# Patient Record
Sex: Female | Born: 1980 | Race: White | Hispanic: No | Marital: Single | Smoking: Never smoker
Health system: Southern US, Community
[De-identification: ages and names within clinical notes are randomized; demographics above are authoritative.]

## PROBLEM LIST (undated history)

## (undated) DIAGNOSIS — M5432 Sciatica, left side: Principal | ICD-10-CM

## (undated) DIAGNOSIS — E559 Vitamin D deficiency, unspecified: Secondary | ICD-10-CM

## (undated) DIAGNOSIS — F39 Unspecified mood [affective] disorder: Secondary | ICD-10-CM

## (undated) DIAGNOSIS — K219 Gastro-esophageal reflux disease without esophagitis: Secondary | ICD-10-CM

## (undated) DIAGNOSIS — Z87442 Personal history of urinary calculi: Secondary | ICD-10-CM

## (undated) DIAGNOSIS — R5383 Other fatigue: Secondary | ICD-10-CM

## (undated) DIAGNOSIS — I10 Essential (primary) hypertension: Secondary | ICD-10-CM

## (undated) DIAGNOSIS — G43909 Migraine, unspecified, not intractable, without status migrainosus: Secondary | ICD-10-CM

## (undated) DIAGNOSIS — G4730 Sleep apnea, unspecified: Secondary | ICD-10-CM

## (undated) DIAGNOSIS — F32A Depression, unspecified: Secondary | ICD-10-CM

## (undated) DIAGNOSIS — F419 Anxiety disorder, unspecified: Secondary | ICD-10-CM

## (undated) DIAGNOSIS — E039 Hypothyroidism, unspecified: Secondary | ICD-10-CM

## (undated) HISTORY — DX: Hypothyroidism, unspecified: E03.9

## (undated) HISTORY — DX: Other fatigue: R53.83

## (undated) HISTORY — DX: Morbid (severe) obesity due to excess calories: E66.01

## (undated) HISTORY — DX: Vitamin D deficiency, unspecified: E55.9

## (undated) HISTORY — DX: Migraine, unspecified, not intractable, without status migrainosus: G43.909

## (undated) HISTORY — DX: Gastro-esophageal reflux disease without esophagitis: K21.9

## (undated) HISTORY — DX: Essential (primary) hypertension: I10

## (undated) HISTORY — DX: Unspecified mood (affective) disorder: F39

## (undated) HISTORY — DX: Sciatica, left side: M54.32

---
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
## 2006-11-01 ENCOUNTER — Emergency Department (HOSPITAL_COMMUNITY): Admission: EM | Admit: 2006-11-01 | Discharge: 2006-11-01 | Payer: Self-pay | Admitting: Emergency Medicine

## 2015-06-04 ENCOUNTER — Other Ambulatory Visit: Payer: Self-pay | Admitting: Family Medicine

## 2015-06-04 DIAGNOSIS — M545 Low back pain: Secondary | ICD-10-CM

## 2015-06-12 ENCOUNTER — Ambulatory Visit
Admission: RE | Admit: 2015-06-12 | Discharge: 2015-06-12 | Disposition: A | Discharge status: Home / Self Care | Payer: 59 | Source: Ambulatory Visit | Attending: Family Medicine | Admitting: Family Medicine

## 2015-06-12 DIAGNOSIS — M545 Low back pain: Secondary | ICD-10-CM

## 2015-10-27 ENCOUNTER — Encounter: Payer: Self-pay | Admitting: Neurology

## 2015-10-27 ENCOUNTER — Ambulatory Visit (INDEPENDENT_AMBULATORY_CARE_PROVIDER_SITE_OTHER): Payer: Self-pay | Admitting: Neurology

## 2015-10-27 ENCOUNTER — Ambulatory Visit (INDEPENDENT_AMBULATORY_CARE_PROVIDER_SITE_OTHER): Payer: 59 | Admitting: Neurology

## 2015-10-27 DIAGNOSIS — M5432 Sciatica, left side: Secondary | ICD-10-CM | POA: Diagnosis not present

## 2015-10-27 HISTORY — DX: Sciatica, left side: M54.32

## 2015-10-27 NOTE — Progress Notes (Signed)
Please refer to EMG and nerve conduction study procedure note. 

## 2015-10-27 NOTE — Procedures (Signed)
     HISTORY:  Cynthia Green is a 35 year old patient with morbid obesity with onset of left buttocks pain with pain down the left leg to the ankle, some paresthesias involving the big toe and the second toe. The patient has had symptoms for about one year. She is being evaluated for a possible lumbar radiculopathy.  NERVE CONDUCTION STUDIES:  Nerve conduction studies were performed on both lower extremities. The distal motor latencies and motor amplitudes for the peroneal and posterior tibial nerves were within normal limits. The nerve conduction velocities for these nerves were also normal. The H reflex latencies were normal. The sensory latencies for the peroneal nerves were within normal limits.   EMG STUDIES:  EMG study was performed on the left lower extremity:  The tibialis anterior muscle reveals 2 to 4K motor units with full recruitment. No fibrillations or positive waves were seen. The peroneus tertius muscle reveals 2 to 4K motor units with full recruitment. No fibrillations or positive waves were seen. The medial gastrocnemius muscle reveals 1 to 3K motor units with full recruitment. No fibrillations or positive waves were seen. The vastus lateralis muscle reveals 2 to 4K motor units with full recruitment. No fibrillations or positive waves were seen. The iliopsoas muscle reveals 2 to 4K motor units with full recruitment. No fibrillations or positive waves were seen. The biceps femoris muscle (long head) reveals 2 to 4K motor units with full recruitment. No fibrillations or positive waves were seen. The lumbosacral paraspinal muscles were tested at 3 levels, and revealed no abnormalities of insertional activity at all 3 levels tested. There was good relaxation.   IMPRESSION:  Nerve conduction studies done on both lower extremities were unremarkable, no evidence of a peripheral neuropathy is seen. EMG evaluation of the left lower extremity was unremarkable, without evidence  of an overlying lumbosacral radiculopathy.  Marlan Palau MD 10/27/2015 1:45 PM  [facility] [address] [place], [place] [address]  Phone [phone] Fax [phone]

## 2016-05-10 ENCOUNTER — Telehealth (HOSPITAL_COMMUNITY): Payer: Self-pay

## 2016-05-10 NOTE — Telephone Encounter (Signed)
PT would like an appt w/ Dr. Michae KavaAgarwal. PT is seeing a therapist and also a psych at this time. She would like to change psychiatrists. Gave her fax info to send psych notes and referral from her therapist.

## 2016-09-13 IMAGING — MR MR LUMBAR SPINE W/O CM
4 of 5 series · 26 of 48 positions shown · non-contrast
Comparison: None.

CLINICAL DATA: Low back pain radiating to the left posterior leg

EXAM:
MRI LUMBAR SPINE WITHOUT CONTRAST
TECHNIQUE: Multiplanar, multisequence MR imaging of the lumbar spine was
performed. No intravenous contrast was administered.

[Series 3: T2 · sagittal · 4.0mm · 0.55mm/px · 5 of 12 slices shown (1 of 2)]
[im 1/12]
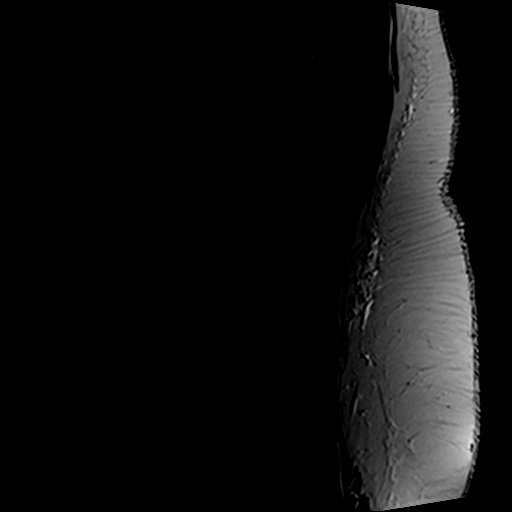
[im 3/12]
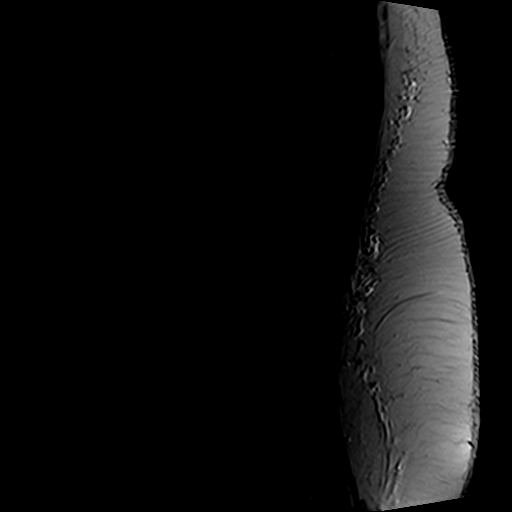
[im 6/12]
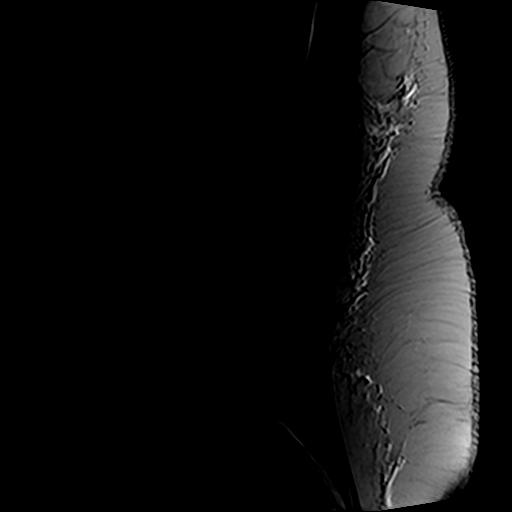
[im 9/12]
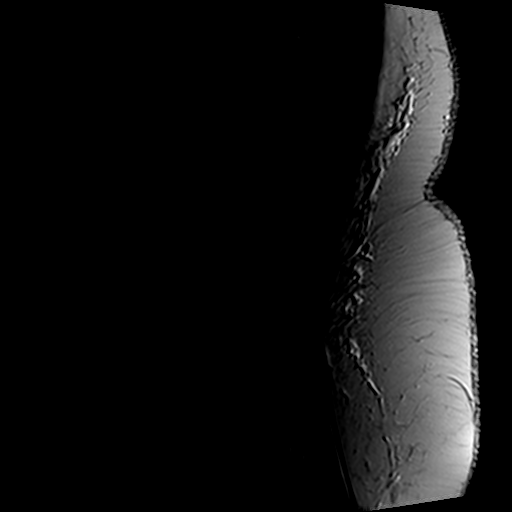
[im 12/12]
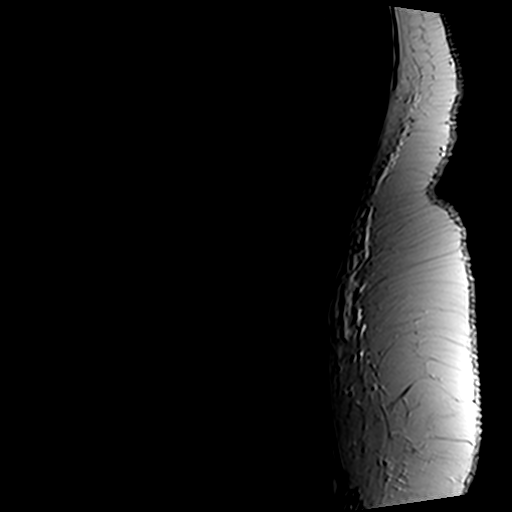

[Series 4: T1 · sagittal · 4.0mm · 0.55mm/px · 5 of 12 slices shown (1 of 2)]
[im 1/12]
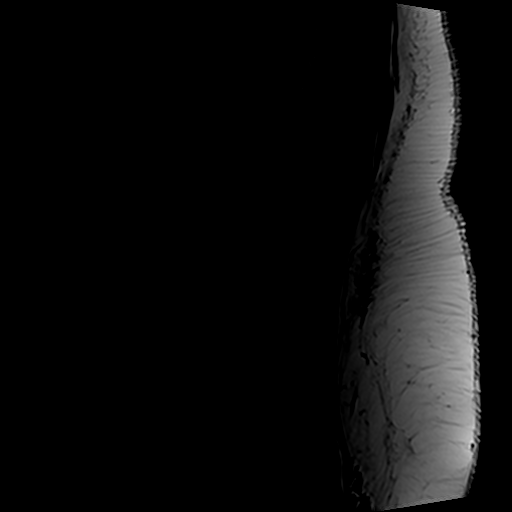
[im 3/12]
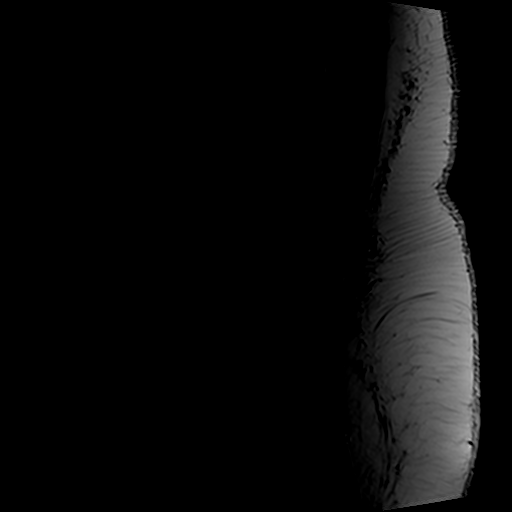
[im 6/12]
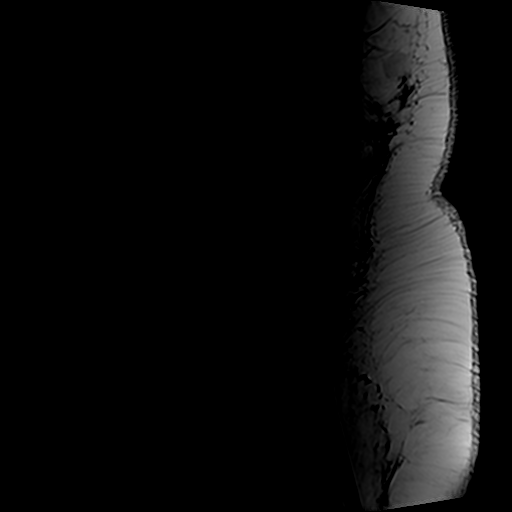
[im 9/12]
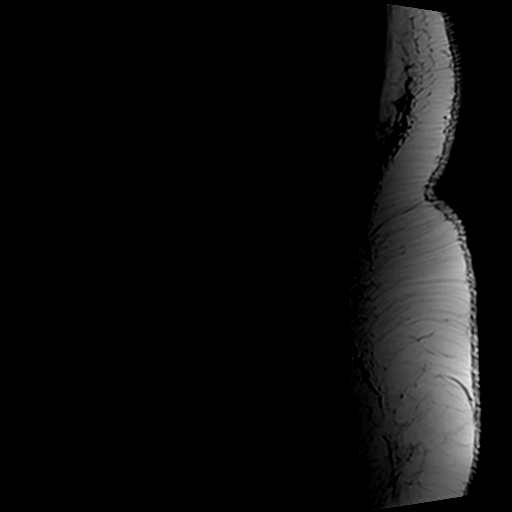
[im 12/12]
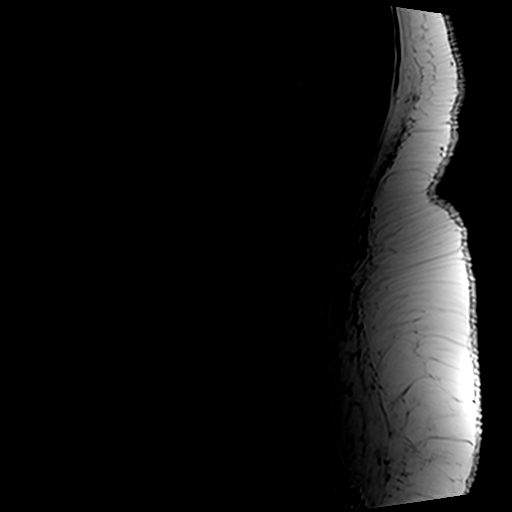

[Series 6: T2 · axial · 4.0mm · 0.70mm/px · z∈[-78,+85]mm · 10 of 34 slices shown (2 of 2)]
[im 3/34]
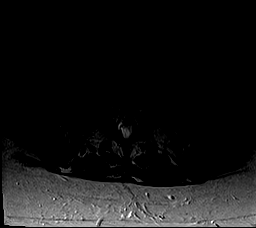
[im 5/34]
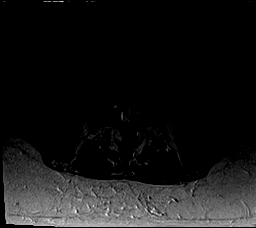
[im 7/34]
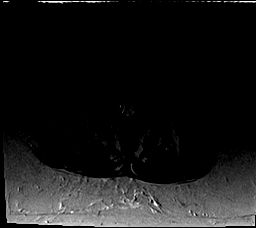
[im 12/34]
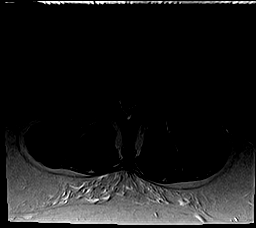
[im 16/34]
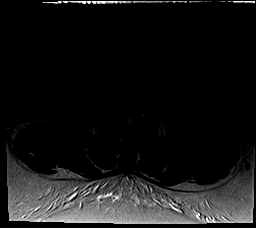
[im 18/34]
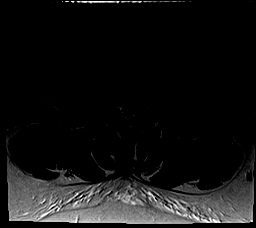
[im 20/34]
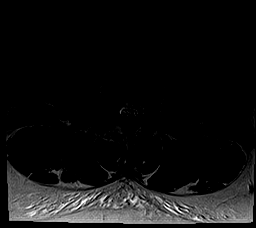
[im 25/34]
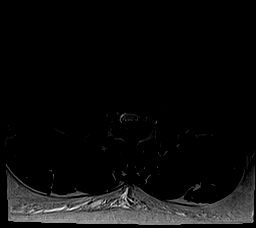
[im 29/34]
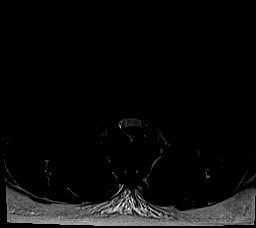
[im 34/34]
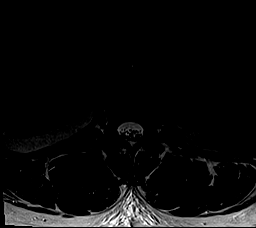

[Series 7: T1 · axial · 4.0mm · 0.35mm/px · z∈[-78,+59]mm · 6 of 34 slices shown (2 of 2)]
[im 3/34]
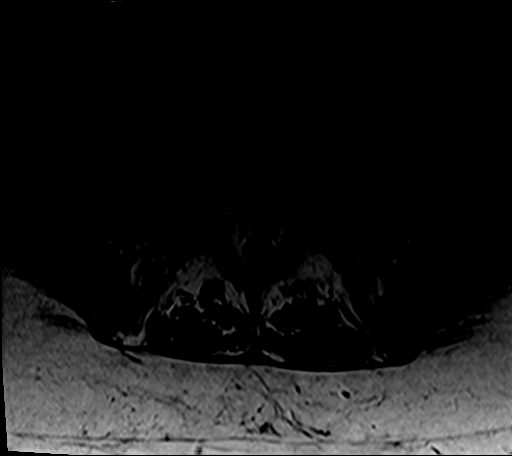
[im 5/34]
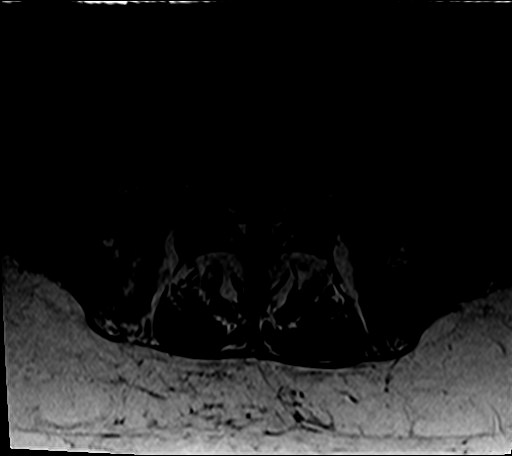
[im 7/34]
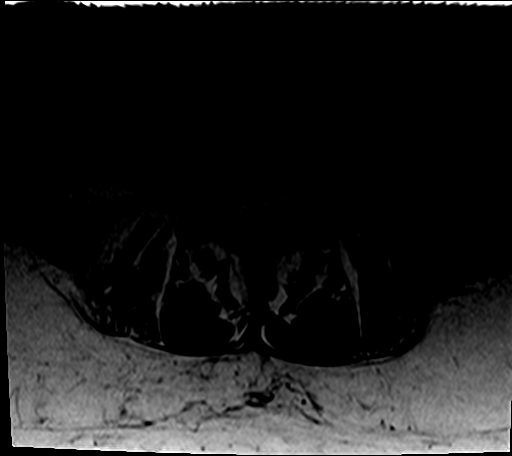
[im 12/34]
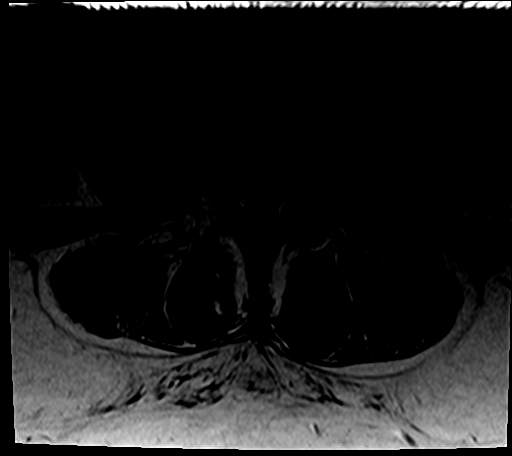
[im 18/34]
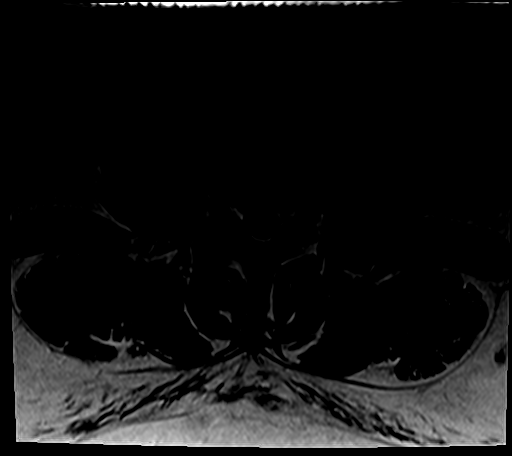
[im 29/34]
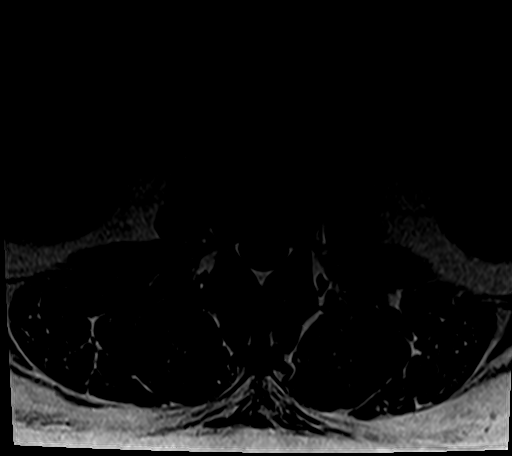

[26 of 48 positions shown; findings below may reference images not displayed]

FINDINGS: The vertebral bodies of the lumbar spine are normal in size. The
vertebral bodies of the lumbar spine are normal in alignment. There
is normal bone marrow signal demonstrated throughout the vertebra.
The intervertebral disc spaces are well-maintained.

The spinal cord is normal in signal and contour. The cord terminates
normally at T12 . The nerve roots of the cauda equina and the filum
terminale are normal. Relative canal narrowing from L3 through S1
likely secondary to short pedicles.

The visualized portions of the SI joints are unremarkable.

The imaged intra-abdominal contents are unremarkable.

T12-L1: No significant disc bulge. No evidence of neural foraminal
stenosis. No central canal stenosis.

L1-L2: No significant disc bulge. No evidence of neural foraminal
stenosis. No central canal stenosis.

L2-L3: No significant disc bulge. No evidence of neural foraminal
stenosis. No central canal stenosis.

L3-L4: Shallow right paracentral disc protrusion. Mild bilateral
facet arthropathy. No evidence of neural foraminal stenosis. No
central canal stenosis.

L4-L5: Mild broad-based disc bulge. Moderate bilateral facet
arthropathy. No evidence of neural foraminal stenosis. No central
canal stenosis.

L5-S1: Eccentric right broad-based disc bulge. Mild bilateral facet
arthropathy. Mild bilateral foraminal narrowing, right greater than
left No central canal stenosis.
IMPRESSION: 1. At L3-4 there is a shallow right paracentral disc protrusion with
mild bilateral facet arthropathy.
2. At L4-5 there is a mild broad-based disc bulge with moderate
bilateral facet arthropathy.
3. At L5-S1 there is eccentric right broad-based disc bulge with
mild bilateral foraminal narrowing, right greater than left.

## 2018-11-21 ENCOUNTER — Ambulatory Visit: Payer: BLUE CROSS/BLUE SHIELD | Admitting: Neurology

## 2018-11-21 ENCOUNTER — Other Ambulatory Visit: Payer: Self-pay

## 2018-11-21 ENCOUNTER — Encounter: Payer: Self-pay | Admitting: Neurology

## 2018-11-21 VITALS — BP 126/92 | HR 103 | Resp 98 | Ht 65.0 in | Wt >= 6400 oz

## 2018-11-21 DIAGNOSIS — Z6841 Body Mass Index (BMI) 40.0 and over, adult: Secondary | ICD-10-CM

## 2018-11-21 DIAGNOSIS — R0683 Snoring: Secondary | ICD-10-CM | POA: Diagnosis not present

## 2018-11-21 DIAGNOSIS — Z9189 Other specified personal risk factors, not elsewhere classified: Secondary | ICD-10-CM

## 2018-11-21 DIAGNOSIS — G4719 Other hypersomnia: Secondary | ICD-10-CM | POA: Diagnosis not present

## 2018-11-21 DIAGNOSIS — R51 Headache: Secondary | ICD-10-CM

## 2018-11-21 DIAGNOSIS — R519 Headache, unspecified: Secondary | ICD-10-CM

## 2018-11-21 NOTE — Patient Instructions (Signed)

## 2018-11-21 NOTE — Progress Notes (Signed)
Subjective:    Patient ID: Cynthia Green is a 38 y.o. female.  HPI     Huston Foley, MD, PhD [facility] [address] [address] [place], [place] [address]  Dear Dr. Earnest Bailey,   I saw your patient, Cynthia Green, upon your kind request in my sleep clinic today for initial consultation of her sleep disorder, in particular, concern for underlying obstructive sleep apnea. The patient is unaccompanied today. As you know, [name] is a 38 year old right-handed woman with an underlying medical history of reflux disease, hypertension, mood disorder, hypothyroidism, vitamin D deficiency, migraine headaches, low back pain, and morbid obesity with a BMI of over 70, who reports snoring and excessive daytime somnolence. I reviewed your office note from 08/14/2018, which you kindly included. Her Epworth sleepiness score is 11 out of 24 today, fatigue score is 54 out of 63. She is single and lives alone, no children. She works for [employer]. She is a nonsmoker and drinks alcohol infrequently, 1 glass every few months on average. He drinks caffeine in the form of reg soda, 20-40 ounces daily. Of note, she is on fluoxetine, as well as potentially sedating medications including lamotrigine 400 mg daily and topiramate 50 mg daily. Of note, she was also started recently on a long-acting carbamazepine. She takes 100 mg at night. She takes the lamotrigine in the morning. Her bedtime is generally between 9 and 10, on weekends she can sleep and an may sleep up to 10 hours or even longer, does not wake up refreshed. She has a history of recurrent headaches, was labeled with cluster headaches in the past as I understand. She has woken up with a headache and also has had nocturnal headaches. She denies a family history of sleep apnea but reports a family history of restless leg syndrome. She has had some infrequent restless legs type symptoms herself. Stretching exercises help  with that. Rise time is variable, generally anywhere between 5 AM and 6:15 AM. Her commute is about 30 minutes. She denies sleepiness at the wheel. She has rarely woken up with a sense of gasping for air. She had recent blood work through your office, she has previously also had her B12 level checked. She had TSH, CBC with differential and CMP checked most recently in your office. She reported that labs were fine. She has 2 cats in the household which need to be fed at 7 AM, on the weekend, she sleeps after feeding her cats.  Her Past Medical History Is Significant For: Past Medical History:  Diagnosis Date  . Chronic sciatica of left side 10/27/2015  . Fatigue   . GERD (gastroesophageal reflux disease)   . Hypertension   . Hypothyroid   . Migraine   . Mood disorder (HCC)   . Morbid obesity (HCC)   . Vitamin D deficiency     Her Past Surgical History Is Significant For: No past surgical history on file.  Her Family History Is Significant For: No family history on file.  Her Social History Is Significant For: Social History   Socioeconomic History  . Marital status: Single    Spouse name: Not on file  . Number of children: Not on file  . Years of education: Not on file  . Highest education level: Not on file  Occupational History  . Not on file  Social Needs  . Financial resource strain: Not on file  . Food insecurity:    Worry: Not on file  Inability: Not on file  . Transportation needs:    Medical: Not on file    Non-medical: Not on file  Tobacco Use  . Smoking status: Never Smoker  . Smokeless tobacco: Never Used  Substance and Sexual Activity  . Alcohol use: Yes  . Drug use: Not on file  . Sexual activity: Not on file  Lifestyle  . Physical activity:    Days per week: Not on file    Minutes per session: Not on file  . Stress: Not on file  Relationships  . Social connections:    Talks on phone: Not on file    Gets together: Not on file    Attends religious  service: Not on file    Active member of club or organization: Not on file    Attends meetings of clubs or organizations: Not on file    Relationship status: Not on file  Other Topics Concern  . Not on file  Social History Narrative  . Not on file    Her Allergies Are:  Allergies  Allergen Reactions  . Morphine And Related Rash  :   Her Current Medications Are:  Outpatient Encounter Medications as of 11/21/2018  Medication Sig  . carbamazepine (EQUETRO) 200 MG CP12 12 hr capsule Take 200 mg by mouth.  Marland Kitchen FLUoxetine (PROZAC) 20 MG capsule Take 20 mg by mouth daily.  . hydrochlorothiazide (HYDRODIURIL) 25 MG tablet Take 25 mg by mouth daily.  Marland Kitchen lamoTRIgine (LAMICTAL) 200 MG tablet Take 200 mg by mouth daily.  Marland Kitchen levothyroxine (SYNTHROID, LEVOTHROID) 88 MCG tablet Take 88 mcg by mouth daily before breakfast.  . topiramate (TOPAMAX) 50 MG tablet Take 50 mg by mouth.  . vitamin B-12 (CYANOCOBALAMIN) 1000 MCG tablet Take 1,000 mcg by mouth daily.  Marland Kitchen VITAMIN D PO Take 2,000 Units by mouth daily.   No facility-administered encounter medications on file as of 11/21/2018.   :  Review of Systems:  Out of a complete 14 point review of systems, all are reviewed and negative with the exception of these symptoms as listed below: Review of Systems  Neurological:       Pt presents today to discuss her sleep. Pt has never had a sleep study but does endorse snoring.  Epworth Sleepiness Scale 0= would never doze 1= slight chance of dozing 2= moderate chance of dozing 3= high chance of dozing  Sitting and reading: 3 Watching TV: 2 Sitting inactive in a public place (ex. Theater or meeting): 1 As a passenger in a car for an hour without a break: 0 Lying down to rest in the afternoon: 3 Sitting and talking to someone: 0 Sitting quietly after lunch (no alcohol): 2 In a car, while stopped in traffic: 0 Total: 11     Objective:  Neurological Exam  Physical Exam Physical Examination:    Vitals:   11/21/18 0856  BP: (!) 126/92  Pulse: (!) 103  Resp: (!) 98   General Examination: The patient is a very pleasant 38 y.o. female in no acute distress. She appears well-developed and well-nourished and well groomed.   HEENT: Normocephalic, atraumatic, pupils are equal, round and reactive to light and accommodation. Extraocular tracking is good without limitation to gaze excursion or nystagmus noted. Normal smooth pursuit is noted. Hearing is grossly intact. Face is symmetric with normal facial animation and normal facial sensation. Speech is clear with no dysarthria noted. There is no hypophonia. There is no lip, neck/head, jaw or voice tremor. Neck is  supple with full range of passive and active motion. There are no carotid bruits on auscultation. Oropharynx exam reveals: mild mouth dryness, adequate dental hygiene and moderate airway crowding, due to smaller airway entry, prominent uvula, tonsils off about 1-2+ bilaterally. Tongue protrudes centrally and palate elevates symmetrically. Mallampati is class II. Neck circumference is 19-1/4 inches. She has a mild/minimal overbite.  Chest: Clear to auscultation without wheezing, rhonchi or crackles noted.  Heart: S1+S2+0, regular and normal without murmurs, rubs or gallops noted.   Abdomen: Soft, non-tender and non-distended with normal bowel sounds appreciated on auscultation.  Extremities: There is trace pitting edema in the distal lower extremities bilaterally.  Skin: Warm and dry without trophic changes noted.  Musculoskeletal: exam reveals no obvious joint deformities, tenderness or joint swelling or erythema.   Neurologically:  Mental status: The patient is awake, alert and oriented in all 4 spheres. Her immediate and remote memory, attention, language skills and fund of knowledge are appropriate. There is no evidence of aphasia, agnosia, apraxia or anomia. Speech is clear with normal prosody and enunciation. Thought process is  linear. Mood is normal and affect is normal.  Cranial nerves II - XII are as described above under HEENT exam. In addition: shoulder shrug is normal with equal shoulder height noted. Motor exam: Normal bulk, strength and tone is noted. There is no drift, tremor or rebound. Romberg is negative. Fine motor skills and coordination: intact with normal finger taps, normal hand movements, normal rapid alternating patting, normal foot taps and normal foot agility.  Cerebellar testing: No dysmetria or intention tremor on finger to nose testing. Heel to shin is unremarkable bilaterally. There is no truncal or gait ataxia.  Sensory exam: intact to light touch in the upper and lower extremities.  Gait, station and balance: She stands easily. No veering to one side is noted. No leaning to one side is noted. Posture is age-appropriate and stance is narrow based. Gait shows normal stride length and normal pace. No problems turning are noted.   Assessment and Plan:  In summary, VERCIE ROADMAN is a very pleasant 38 y.o.-year old female with an underlying medical history of reflux disease, hypertension, mood disorder, hypothyroidism, vitamin D deficiency, migraine headaches, low back pain, and morbid obesity with a BMI of over 70, whose history and physical exam are concerning for obstructive sleep apnea (OSA). I had a long chat with the patient about my findings and the diagnosis of OSA, its prognosis and treatment options. We talked about medical treatments, surgical interventions and non-pharmacological approaches. I explained in particular the risks and ramifications of untreated moderate to severe OSA, especially with respect to developing cardiovascular disease down the Road, including congestive heart failure, difficult to treat hypertension, cardiac arrhythmias, or stroke. Even type 2 diabetes has, in part, been linked to untreated OSA. Symptoms of untreated OSA include daytime sleepiness, memory problems, mood  irritability and mood disorder such as depression and anxiety, lack of energy, as well as recurrent headaches, especially morning headaches. We talked about trying to maintain a healthy lifestyle in general, as well as the importance of weight control. I encouraged the patient to eat healthy, exercise daily and keep well hydrated, to keep a scheduled bedtime and wake time routine, to not skip any meals and eat healthy snacks in between meals. I advised the patient not to drive when feeling sleepy. I recommended the following at this time: sleep study with potential positive airway pressure titration. (We will score hypopneas at 3%).  I explained the sleep test procedure to the patient and also outlined possible surgical and non-surgical treatment options of OSA, including the use of a custom-made dental device (which would require a referral to a specialist dentist or oral surgeon), upper airway surgical options, such as pillar implants, radiofrequency surgery, tongue base surgery, and UPPP (which would involve a referral to an ENT surgeon). Rarely, jaw surgery such as mandibular advancement may be considered.  I also explained the CPAP treatment option to the patient, who indicated that she would be willing to try CPAP if the need arises. I explained the importance of being compliant with PAP treatment, not only for insurance purposes but primarily to improve Her symptoms, and for the patient's long term health benefit, including to reduce Her cardiovascular risks. I answered all her questions today and the patient was in agreement. I plan to see her back after the sleep study is completed and encouraged her to call with any interim questions, concerns, problems or updates.   Thank you very much for allowing me to participate in the care of this nice patient. If I can be of any further assistance to you please do not hesitate to call me at [phone].  Sincerely,   Huston Foley, MD, PhD

## 2018-11-23 ENCOUNTER — Institutional Professional Consult (permissible substitution): Payer: 59 | Admitting: Neurology

## 2018-12-27 ENCOUNTER — Telehealth: Payer: Self-pay | Admitting: Neurology

## 2018-12-27 NOTE — Telephone Encounter (Signed)
HST order was placed on 11/21/2018.

## 2018-12-27 NOTE — Telephone Encounter (Signed)
Cancelled in lab study due to COVID-19. Please place order for hst

## 2018-12-31 ENCOUNTER — Ambulatory Visit (INDEPENDENT_AMBULATORY_CARE_PROVIDER_SITE_OTHER): Payer: BLUE CROSS/BLUE SHIELD | Admitting: Neurology

## 2018-12-31 DIAGNOSIS — G4733 Obstructive sleep apnea (adult) (pediatric): Secondary | ICD-10-CM

## 2018-12-31 DIAGNOSIS — R51 Headache: Secondary | ICD-10-CM

## 2018-12-31 DIAGNOSIS — G4719 Other hypersomnia: Secondary | ICD-10-CM

## 2018-12-31 DIAGNOSIS — R519 Headache, unspecified: Secondary | ICD-10-CM

## 2018-12-31 DIAGNOSIS — Z6841 Body Mass Index (BMI) 40.0 and over, adult: Secondary | ICD-10-CM

## 2018-12-31 DIAGNOSIS — R0683 Snoring: Secondary | ICD-10-CM

## 2018-12-31 DIAGNOSIS — Z9189 Other specified personal risk factors, not elsewhere classified: Secondary | ICD-10-CM

## 2019-01-08 NOTE — Progress Notes (Signed)
Home sleep test report encounter

## 2019-01-08 NOTE — Progress Notes (Signed)
Patient referred by Dr. Earnest Bailey, seen by me on 11/21/18, HST on 12/31/18.    Please call and notify the patient that the recent home sleep test showed obstructive sleep apnea in the severe range. While I recommend treatment for this in the form CPAP, we are not yet bringing patients in for in-lab testing for CPAP titration studies, due to the virus pandemic; therefore, I suggest we start her on a trial of autoPAP at home, which means, that we don't have to bring her in for a sleep study with CPAP, but will let her start using an autoPAP machine at home, through a DME company (of patient's choice, or as per insurance requirement, as per in [employer], if there are such restrictions, depending on insurance carrier). The DME representative will educate the patient on how to use the machine, how to put the mask on, etc. I have placed an order in the chart. Please send referral, talk to patient, send report to referring MD. We will need a FU in sleep clinic for 10 weeks post-PAP set up, please arrange that with me or one of our NPs.  Also, please remind patient about the importance of compliance with PAP usage; this is an Barista, but good compliance also helps [place] track improvements in patient's sleep related complaints and objective improvements, such as BP and weight for example or nocturia or headaches, etc. For concerns and questions about how to clean the PAP machine and the supplies and how frequently to change the hose, mask and filters, etc., patient can call the DME company, for more information, education and troubleshooting. Especially in the current situation, I recommend, patients be extra mindful about hand hygiene, handling the PAP equipment only with clean hands, wipe the mask daily, keep little one and four-legged companions (and any other pets for that matter) away from the machine and mask at all times.    Thanks,   Huston Foley, MD, PhD [facility]  [facility])

## 2019-01-08 NOTE — Procedures (Signed)
Patient Information     First Name: Cynthia Last Name: Green ID: [id number]  Birth Date: Dec 05, 1980 Age: 38 Gender: Female  Referring Physician: Macy Mis, MD BMI: 70.2 (W=421 lb, H=5' 5'')  Neck Circ.:  19 '' Epworth:  11/24   Sleep Study Information    Study Date: Dec 31, 2018 S/H/A Version: [phone] / 4.1.1528 / 55  History:  38 year old woman with a history of reflux disease, hypertension, mood disorder, hypothyroidism, vitamin D deficiency, migraine headaches, low back pain, and morbid obesity, who reports snoring and excessive daytime somnolence. Summary & Diagnosis:    OSA, severe Recommendations:     This home sleep test demonstrates severe obstructive sleep apnea with a total AHI of 103/hour and O2 nadir of 75%. Treatment with positive airway pressure - in the form of CPAP - is recommended. This will require, ideally, require a full night CPAP titration study for proper treatment settings, O2 monitoring and mask fitting. Based on the severity of the sleep disordered breathing, an attended titration study is indicated. However, under the current circumstances (i.e. the COVID-19 pandemic), in order to ensure ongoing good care and for the safety of the patient, she will be advised to proceed with an autoPAP titration/trial at home. A proper titration study with CPAP may be helpful or needed down the road, when considered safe. Please note, that untreated obstructive sleep apnea may carry additional perioperative morbidity. Patients with significant obstructive sleep apnea should receive perioperative PAP therapy and the surgeons and particularly the anesthesiologist should be informed of the diagnosis and the severity of the sleep disordered breathing. Patient will be reminded regarding compliance with her PAP machine and to be mindful of cleanliness with the equipment and timely with supply changes (i.e. changing filter, mask, hose, humidifier chamber on an ongoing basis as recommended,  and cleaning parts that touch the face and nose daily, etc). The patient should be cautioned not to drive, work at heights, or operate dangerous or heavy equipment when tired or sleepy. Review and reiteration of good sleep hygiene measures should be pursued with any patient. Other causes of the patient's symptoms, including circadian rhythm disturbances, an underlying mood disorder, medication effect and/or an underlying medical problem cannot be ruled out based on this test. Clinical correlation is recommended. The patient and her referring provider will be notified of the test results. The patient will be seen in follow up in sleep clinic at [facility], either for a face-to-face or virtual visit, whichever feasible and recommended at the time.  I certify that I have reviewed the raw data recording prior to the issuance of this report in accordance with the standards of the American Academy of Sleep Medicine (AASM).  Huston Foley, MD, PhD [facility]) Diplomat, ABPN (Neurology and Sleep)                Sleep Summary  Oxygen Saturation Statistics   Start Study Time: End Study Time: Total Recording Time:  9:20:49 PM 6:30:00 AM 9 hrs, 9 min  Total Sleep Time % REM of Sleep Time:  8 hrs, 33 min 12.9    Mean: 92 Minimum: 75 Maximum: 100  Mean of Desaturations Nadirs (%):   87  Oxygen Desaturation %:  4-9 10-20 >20 Total  Events Number Total   268 34.4  510  1 65.5 0.1  779 100.0  Oxygen Saturation: <90 <=88  <85 <80 <70  Duration (minutes): Sleep % 105.6 20.6 79.1 15.4 18.3 1.6 3.6  0.3 0.0 0.0     Respiratory Indices      Total Events REM NREM All Night  pRDI:  865  pAHI:  865 ODI:  779  pAHIc: % CSR: 65.8 65.8 61.3 108.5 108.5 97.4 103.0 103.0 92.7       Pulse Rate Statistics during Sleep (BPM)      Mean: 94 Minimum: 53 Maximum: 124    Indices are calculated using technically valid sleep time of  8 hrs, 24 min. Inconclusive AHI  Central pRDI/pAHI are calculated using oxi desaturations ? 3%  Body Position Statistics  Position Supine Prone Right Left Non-Supine  Sleep (min) 512.2 0.0 0.0 0.0 0.0  Sleep % 99.8 0.0 0.0 0.0 0.0  pRDI 102.9 N/A N/A N/A N/A  pAHI 102.9 N/A N/A N/A N/A  ODI 92.9 N/A N/A N/A N/A     Snoring Statistics Snoring Level (dB) >40 >50 >60 >70 >80 >Threshold (45)  Sleep (min) 293.4 171.1 85.9 0.0 0.0 212.6  Sleep % 57.2 33.3 16.7 0.0 0.0 41.4    Mean: 48 dB Sleep Stages Chart                                                                             pAHI=103.0                                                                                               Mild              Moderate                    Severe                                                    5              15                    30

## 2019-01-08 NOTE — Addendum Note (Signed)
Addended by: Huston Foley on: 01/08/2019 08:17 AM   Modules accepted: Orders

## 2019-01-09 ENCOUNTER — Telehealth: Payer: Self-pay

## 2019-01-09 NOTE — Telephone Encounter (Signed)
I called pt. I advised pt that Dr. Frances Furbish reviewed their sleep study results and found that pt has severe osa. Dr. Frances Furbish recommends that pt start an auto pap at home. I reviewed PAP compliance expectations with the pt. Pt is agreeable to starting an auto-PAP. I advised pt that an order will be sent to a DME, [facility], and Christoper Allegra will call the pt within about one week after they file with the pt's insurance. Christoper Allegra will show the pt how to use the machine, fit for masks, and troubleshoot the auto-PAP if needed. A follow up appt was made for insurance purposes with Aundra Millet, NP on 04/03/19 at 7:30am. Pt verbalized understanding to arrive 15 minutes early and bring their auto-PAP. A letter with all of this information in it will be mailed to the pt as a reminder. I verified with the pt that the address we have on file is correct. Pt verbalized understanding of results. Pt had no questions at this time but was encouraged to call back if questions arise. I have sent the order to [facility] and have received confirmation that they have received the order.

## 2019-01-09 NOTE — Telephone Encounter (Signed)
-----   Message from Huston Foley, MD sent at 01/08/2019  8:17 AM EDT ----- Patient referred by Dr. Earnest Bailey, seen by me on 11/21/18, HST on 12/31/18.    Please call and notify the patient that the recent home sleep test showed obstructive sleep apnea in the severe range. While I recommend treatment for this in the form CPAP, we are not yet bringing patients in for in-lab testing for CPAP titration studies, due to the virus pandemic; therefore, I suggest we start her on a trial of autoPAP at home, which means, that we don't have to bring her in for a sleep study with CPAP, but will let her start using an autoPAP machine at home, through a DME company (of patient's choice, or as per insurance requirement, as per in [employer], if there are such restrictions, depending on insurance carrier). The DME representative will educate the patient on how to use the machine, how to put the mask on, etc. I have placed an order in the chart. Please send referral, talk to patient, send report to referring MD. We will need a FU in sleep clinic for 10 weeks post-PAP set up, please arrange that with me or one of our NPs.  Also, please remind patient about the importance of compliance with PAP usage; this is an Barista, but good compliance also helps [place] track improvements in patient's sleep related complaints and objective improvements, such as BP and weight for example or nocturia or headaches, etc. For concerns and questions about how to clean the PAP machine and the supplies and how frequently to change the hose, mask and filters, etc., patient can call the DME company, for more information, education and troubleshooting. Especially in the current situation, I recommend, patients be extra mindful about hand hygiene, handling the PAP equipment only with clean hands, wipe the mask daily, keep little one and four-legged companions (and any other pets for that matter) away from the machine and mask at all times.      Thanks,   Huston Foley, MD, PhD [facility])

## 2019-04-01 ENCOUNTER — Telehealth: Payer: Self-pay | Admitting: Adult Health

## 2019-04-01 ENCOUNTER — Encounter: Payer: Self-pay | Admitting: Neurology

## 2019-04-01 NOTE — Telephone Encounter (Signed)
I called patient about rescheduling her 7/22 appt with Megan, due to it being at 7:30 am, and that is now an unavailable schedule time for providers. I offered patient a virtual visit instead for the afternoon of 7/21, and patient accepted. I explained to patient that it will be through [place], and patient verbalized understanding of the process.  Pt understands that although there may be some limitations with this type of visit, we will take all precautions to reduce any security or privacy concerns.  Pt understands that this will be treated like an in office visit and we will file with pt's insurance, and there may be a patient responsible charge related to this service.

## 2019-04-02 ENCOUNTER — Telehealth (INDEPENDENT_AMBULATORY_CARE_PROVIDER_SITE_OTHER): Payer: BLUE CROSS/BLUE SHIELD | Admitting: Adult Health

## 2019-04-02 DIAGNOSIS — Z9989 Dependence on other enabling machines and devices: Secondary | ICD-10-CM

## 2019-04-02 DIAGNOSIS — G4733 Obstructive sleep apnea (adult) (pediatric): Secondary | ICD-10-CM | POA: Diagnosis not present

## 2019-04-02 NOTE — Progress Notes (Addendum)
PATIENT: Tyler AasBrittany L Telford DOB: 04/14/1981  REASON FOR VISIT: follow up HISTORY FROM: patient  Virtual Visit via Video Note  I connected with Tyler AasBrittany L Lafauci on 04/02/19 at  3:00 PM EDT by a video enabled telemedicine application [facility] and verified that I am speaking with the correct person using two identifiers who was located at their own home.   I discussed the limitations of evaluation and management by telemedicine and the availability of in person appointments. The patient expressed understanding and agreed to proceed.   PATIENT: Tyler AasBrittany L Popoff DOB: 12/19/1980  REASON FOR VISIT: follow up HISTORY FROM: patient  HISTORY OF PRESENT ILLNESS: Today 04/02/19:  Ms. Meriam SpragueBeverly is a 38 year old female with a history of obstructive sleep apnea on CPAP.  She joins me today for virtual visit.  Her download indicates that she use her machine nightly for compliance of 100%.  She used her machine greater than 4 hours 29 days for compliance of 97%.  On average she uses her machine 8 hours and 32 minutes.  Her residual AHI is 0.2 on 7 to 14 cm of water with EPR of 3.  Her leak in the 95th percentile is 7.2 L/min.  She states that she is doing well on the CPAP.  She states that she is not having daytime sleepiness like she was.  Reports that she is not having to take naps throughout the day.  She joins me today for virtual visit.  HISTORY Ms. Meriam SpragueBeverly is a 38 year old right-handed woman with an underlying medical history of reflux disease, hypertension, mood disorder, hypothyroidism, vitamin D deficiency, migraine headaches, low back pain, and morbid obesity with a BMI of over 70, who reports snoring and excessive daytime somnolence. I reviewed your office note from 08/14/2018, which you kindly included. Her Epworth sleepiness score is 11 out of 24 today, fatigue score is 54 out of 63. She is single and lives alone, no children. She works for [employer]. She is a nonsmoker and drinks alcohol infrequently, 1 glass every few months on average. He drinks caffeine in the form of reg soda, 20-40 ounces daily. Of note, she is on fluoxetine, as well as potentially sedating medications including lamotrigine 400 mg daily and topiramate 50 mg daily. Of note, she was also started recently on a long-acting carbamazepine. She takes 100 mg at night. She takes the lamotrigine in the morning. Her bedtime is generally between 9 and 10, on weekends she can sleep and an may sleep up to 10 hours or even longer, does not wake up refreshed. She has a history of recurrent headaches, was labeled with cluster headaches in the past as I understand. She has woken up with a headache and also has had nocturnal headaches. She denies a family history of sleep apnea but reports a family history of restless leg syndrome. She has had some infrequent restless legs type symptoms herself. Stretching exercises help with that. Rise time is variable, generally anywhere between 5 AM and 6:15 AM. Her commute is about 30 minutes. She denies sleepiness at the wheel. She has rarely woken up with a sense of gasping for air. She had recent blood work through your office, she has previously also had her B12 level checked. She had TSH, CBC with differential and CMP checked most recently in your office. She reported that labs were fine. She has 2 cats in the household which need to be fed at 7 AM, on the  weekend, she sleeps after feeding her cats.   REVIEW OF SYSTEMS: Out of a complete 14 system review of symptoms, the patient complains only of the following symptoms, and all other reviewed systems are negative.  See HPI  ALLERGIES: Allergies  Allergen Reactions  . Morphine And Related Rash    HOME MEDICATIONS: Outpatient Medications Prior to Visit  Medication Sig Dispense Refill  . carbamazepine (EQUETRO) 200 MG CP12 12 hr capsule Take 200 mg by mouth.    Marland Kitchen. FLUoxetine (PROZAC) 20 MG  capsule Take 20 mg by mouth daily.    . hydrochlorothiazide (HYDRODIURIL) 25 MG tablet Take 25 mg by mouth daily.    Marland Kitchen. lamoTRIgine (LAMICTAL) 200 MG tablet Take 200 mg by mouth daily.    Marland Kitchen. levothyroxine (SYNTHROID, LEVOTHROID) 88 MCG tablet Take 88 mcg by mouth daily before breakfast.    . topiramate (TOPAMAX) 50 MG tablet Take 50 mg by mouth.    . vitamin B-12 (CYANOCOBALAMIN) 1000 MCG tablet Take 1,000 mcg by mouth daily.    Marland Kitchen. VITAMIN D PO Take 2,000 Units by mouth daily.     No facility-administered medications prior to visit.     PAST MEDICAL HISTORY: Past Medical History:  Diagnosis Date  . Chronic sciatica of left side 10/27/2015  . Fatigue   . GERD (gastroesophageal reflux disease)   . Hypertension   . Hypothyroid   . Migraine   . Mood disorder (HCC)   . Morbid obesity (HCC)   . Vitamin D deficiency     PAST SURGICAL HISTORY: No past surgical history on file.  FAMILY HISTORY: No family history on file.  SOCIAL HISTORY: Social History   Socioeconomic History  . Marital status: Single    Spouse name: Not on file  . Number of children: Not on file  . Years of education: Not on file  . Highest education level: Not on file  Occupational History  . Not on file  Social Needs  . Financial resource strain: Not on file  . Food insecurity    Worry: Not on file    Inability: Not on file  . Transportation needs    Medical: Not on file    Non-medical: Not on file  Tobacco Use  . Smoking status: Never Smoker  . Smokeless tobacco: Never Used  Substance and Sexual Activity  . Alcohol use: Yes  . Drug use: Not on file  . Sexual activity: Not on file  Lifestyle  . Physical activity    Days per week: Not on file    Minutes per session: Not on file  . Stress: Not on file  Relationships  . Social Musicianconnections    Talks on phone: Not on file    Gets together: Not on file    Attends religious service: Not on file    Active member of club or organization: Not on file     Attends meetings of clubs or organizations: Not on file    Relationship status: Not on file  . Intimate partner violence    Fear of current or ex partner: Not on file    Emotionally abused: Not on file    Physically abused: Not on file    Forced sexual activity: Not on file  Other Topics Concern  . Not on file  Social History Narrative  . Not on file      PHYSICAL EXAM Generalized: Well developed, in no acute distress   Neurological examination  Mentation: Alert oriented to time, place,  history taking. Follows all commands speech and language fluent Cranial nerve II-XII:Extraocular movements were full. Facial symmetry noted. Marland Kitchen Head turning and shoulder shrug  were normal and symmetric. Motor: Good strength throughout subjectively per patient Sensory: Sensory testing is intact to soft touch on all 4 extremities subjectively per patient Coordination: Cerebellar testing reveals good finger-nose-finger    DIAGNOSTIC DATA (LABS, IMAGING, TESTING) - I reviewed patient records, labs, notes, testing and imaging myself where available.     ASSESSMENT AND PLAN 38 y.o. year old female  has a past medical history of Chronic sciatica of left side (10/27/2015), Fatigue, GERD (gastroesophageal reflux disease), Hypertension, Hypothyroid, Migraine, Mood disorder ([place]), Morbid obesity ([place]), and Vitamin D deficiency. here with:  1: OSA on CPAP  Patient CPAP download shows excellent compliance and good treatment of her apnea.  She is encouraged to continue using CPAP nightly and greater than 4 hours each night.  She is advised that if her symptoms worsen or she develops new symptoms she should let us know.  She will follow-up in 1 year    I spent 15 minutes with the patient. 50% of this time was spent reviewing CPAP download   Ward Givens, MSN, NP-C 04/02/2019, 2:49 PM [facility] [address] [place], [place] [address] [phone]  I reviewed the above  note and documentation by the Nurse Practitioner and agree with the history, physical exam, assessment and plan as outlined above. I was immediately available for consultation. Star Age, MD, PhD [facility])

## 2019-04-03 ENCOUNTER — Ambulatory Visit: Payer: Self-pay | Admitting: Adult Health

## 2019-10-30 ENCOUNTER — Emergency Department
Admission: EM | Admit: 2019-10-30 | Discharge: 2019-10-30 | Disposition: A | Discharge status: Home / Self Care | Payer: BC Managed Care – PPO | Attending: Emergency Medicine | Admitting: Emergency Medicine

## 2019-10-30 ENCOUNTER — Encounter: Payer: Self-pay | Admitting: Emergency Medicine

## 2019-10-30 ENCOUNTER — Other Ambulatory Visit: Payer: Self-pay

## 2019-10-30 DIAGNOSIS — Z79899 Other long term (current) drug therapy: Secondary | ICD-10-CM | POA: Diagnosis not present

## 2019-10-30 DIAGNOSIS — E039 Hypothyroidism, unspecified: Secondary | ICD-10-CM | POA: Insufficient documentation

## 2019-10-30 DIAGNOSIS — S20419D Abrasion of unspecified back wall of thorax, subsequent encounter: Secondary | ICD-10-CM | POA: Diagnosis not present

## 2019-10-30 DIAGNOSIS — M5441 Lumbago with sciatica, right side: Secondary | ICD-10-CM | POA: Diagnosis not present

## 2019-10-30 DIAGNOSIS — W5503XD Scratched by cat, subsequent encounter: Secondary | ICD-10-CM | POA: Diagnosis not present

## 2019-10-30 DIAGNOSIS — I10 Essential (primary) hypertension: Secondary | ICD-10-CM | POA: Diagnosis not present

## 2019-10-30 DIAGNOSIS — M5431 Sciatica, right side: Secondary | ICD-10-CM

## 2019-10-30 DIAGNOSIS — Z5189 Encounter for other specified aftercare: Secondary | ICD-10-CM

## 2019-10-30 DIAGNOSIS — R030 Elevated blood-pressure reading, without diagnosis of hypertension: Secondary | ICD-10-CM

## 2019-10-30 DIAGNOSIS — M545 Low back pain: Secondary | ICD-10-CM | POA: Diagnosis present

## 2019-10-30 LAB — BASIC METABOLIC PANEL
Anion gap: 11 (ref 5–15)
BUN: 15 mg/dL (ref 6–20)
CO2: 21 mmol/L — ABNORMAL LOW (ref 22–32)
Calcium: 9.2 mg/dL (ref 8.9–10.3)
Chloride: 109 mmol/L (ref 98–111)
Creatinine, Ser: 0.84 mg/dL (ref 0.44–1.00)
GFR calc Af Amer: >60 mL/min (ref >60)
GFR calc non Af Amer: >60 mL/min (ref >60)
Glucose, Bld: 106 mg/dL — ABNORMAL HIGH (ref 70–99)
Potassium: 3.4 mmol/L — ABNORMAL LOW (ref 3.5–5.1)
Sodium: 141 mmol/L (ref 135–145)

## 2019-10-30 LAB — CBC WITH DIFFERENTIAL/PLATELET
Abs Immature Granulocytes: 0.05 10*3/uL (ref 0.00–0.07)
Basophils Absolute: 0.1 10*3/uL (ref 0.0–0.1)
Basophils Relative: 1 %
Eosinophils Absolute: 0.1 10*3/uL (ref 0.0–0.5)
Eosinophils Relative: 1 %
HCT: 46 % (ref 36.0–46.0)
Hemoglobin: 15.7 g/dL — ABNORMAL HIGH (ref 12.0–15.0)
Immature Granulocytes: 1 %
Lymphocytes Relative: 28 %
Lymphs Abs: 2.8 10*3/uL (ref 0.7–4.0)
MCH: 31.2 pg (ref 26.0–34.0)
MCHC: 34.1 g/dL (ref 30.0–36.0)
MCV: 91.3 fL (ref 80.0–100.0)
Monocytes Absolute: 0.8 10*3/uL (ref 0.1–1.0)
Monocytes Relative: 8 %
Neutro Abs: 6.4 10*3/uL (ref 1.7–7.7)
Neutrophils Relative %: 61 %
Platelets: 330 10*3/uL (ref 150–400)
RBC: 5.04 MIL/uL (ref 3.87–5.11)
RDW: 12.6 % (ref 11.5–15.5)
WBC: 10.1 10*3/uL (ref 4.0–10.5)
nRBC: 0 % (ref 0.0–0.2)

## 2019-10-30 MED ORDER — PREDNISONE 10 MG PO TABS: ORAL_TABLET | ORAL | 0 refills | Status: DC

## 2019-10-30 MED ORDER — KETOROLAC TROMETHAMINE 30 MG/ML IJ SOLN
30.0000 mg | Freq: Once | INTRAMUSCULAR | Status: AC
  Administered 2019-10-30: 30 mg via INTRAMUSCULAR
  Filled 2019-10-30: qty 1

## 2019-10-30 NOTE — ED Notes (Signed)
Topaz pad not working in room. Pt verbalized understanding prescriptions and follow ups. Pt ambulated to the lobby in no distress.

## 2019-10-30 NOTE — ED Notes (Signed)
Urine specimen sent to lab

## 2019-10-30 NOTE — Discharge Instructions (Addendum)
Follow-up with your primary care provider if any continued problems or return to the emergency department if any severe worsening of your back or sciatica or urgent concerns.  A prescription for prednisone was sent to your pharmacy.  Begin taking this once a day hopefully at the same time each day.  You may continue regular medication.  Also a list of foods high in potassium was printed for you as you were minimally low most likely from your hydrochlorothiazide.  Also follow-up with your primary care provider for recheck of your blood pressure as it was elevated in the ED today.

## 2019-10-30 NOTE — ED Triage Notes (Signed)
Pt reports has been getting PT for sciatica and all was improved but then had a flare up and is now in pain.  PA concerned that pt has an abscess somewhere because she is having a flare up of sciatica and was also recently treated for an infection to a cat scratch.

## 2019-10-30 NOTE — ED Provider Notes (Signed)
[facility] Emergency Department Provider Note   ____________________________________________   First MD Initiated Contact with Patient 10/30/19 1410     (approximate)  I have reviewed the triage vital signs and the nursing notes.   HISTORY  Chief Complaint Back Pain and Leg Pain   HPI Cynthia Green is a 39 y.o. female presents to the ED with complaint of sciatica flareup.  Patient has a history of sciatica and is now going down her right leg as it has in the past.  Patient denies any new injury.  Patient also had a cat scratch/possible bite to her upper back that happened several weeks ago.  Patient states she was seen at an urgent care where she was placed on Bactrim, Zithromax and Cipro.  Patient went to the same urgent care today and was told that most likely she has an epidural abscess and that they were concerned.  She was instructed to go to the emergency department to have this evaluated.  Patient states that she completed antibiotics except for Cipro which she had a reaction.  Patient denies any fever, chills, nausea or vomiting.  Patient rates her pain as 4 out of 10.       Past Medical History:  Diagnosis Date  . Chronic sciatica of left side 10/27/2015  . Fatigue   . GERD (gastroesophageal reflux disease)   . Hypertension   . Hypothyroid   . Migraine   . Mood disorder ([place])   . Morbid obesity ([place])   . Vitamin D deficiency     Patient Active Problem List   Diagnosis Date Noted  . Chronic sciatica of left side 10/27/2015    History reviewed. No pertinent surgical history.  Prior to Admission medications   Medication Sig Start Date End Date Taking? Authorizing Provider  carbamazepine (EQUETRO) 200 MG CP12 12 hr capsule Take 200 mg by mouth.    [provider]  FLUoxetine (PROZAC) 20 MG capsule Take 20 mg by mouth daily.    [provider]  hydrochlorothiazide (HYDRODIURIL) 25 MG tablet Take 25 mg by mouth daily.     [provider]  lamoTRIgine (LAMICTAL) 200 MG tablet Take 200 mg by mouth daily.    [provider]  levothyroxine (SYNTHROID, LEVOTHROID) 88 MCG tablet Take 88 mcg by mouth daily before breakfast.    [provider]  predniSONE (DELTASONE) 10 MG tablet Take 6 tablets  today, on day 2 take 5 tablets, day 3 take 4 tablets, day 4 take 3 tablets, day 5 take  2 tablets and 1 tablet the last day 10/30/19   Johnn Hai, PA-C  topiramate (TOPAMAX) 50 MG tablet Take 50 mg by mouth.    [provider]  vitamin B-12 (CYANOCOBALAMIN) 1000 MCG tablet Take 1,000 mcg by mouth daily.    [provider]  VITAMIN D PO Take 2,000 Units by mouth daily.    [provider]    Allergies Morphine and related  No family history on file.  Social History Social History   Tobacco Use  . Smoking status: Never Smoker  . Smokeless tobacco: Never Used  Substance Use Topics  . Alcohol use: Yes  . Drug use: Not on file    Review of Systems Constitutional: No fever/chills Cardiovascular: Denies chest pain. Respiratory: Denies shortness of breath. Gastrointestinal: No abdominal pain.  No nausea, no vomiting.  No diarrhea.  No constipation. Genitourinary: Negative for dysuria. Musculoskeletal: Positive for right lower back pain  with radiation into the right lower extremity. Skin: Positive for cat scratch 2 weeks ago. Neurological: Negative for headaches, focal weakness or numbness. ____________________________________________   PHYSICAL EXAM:  VITAL SIGNS: ED Triage Vitals  Enc Vitals Group     BP 10/30/19 1339 (!) 163/95     Pulse Rate 10/30/19 1339 (!) 116     Resp 10/30/19 1339 (!) 22     Temp 10/30/19 1339 98.3 F (36.8 C)     Temp Source 10/30/19 1339 Oral     SpO2 10/30/19 1339 96 %     Weight 10/30/19 1334 (!) 421 lb (191 kg)     Height 10/30/19 1334 5\' 5"  (1.651 m)     Head Circumference --      Peak Flow --      Pain Score  10/30/19 1333 4     Pain Loc --      Pain Edu? --      Excl. in GC? --     Constitutional: Alert and oriented. Well appearing and in no acute distress. Eyes: Conjunctivae are normal.  Head: Atraumatic. Neck: No stridor.   Cardiovascular: Normal rate, regular rhythm. Grossly normal heart sounds.  Good peripheral circulation. Respiratory: Normal respiratory effort.  No retractions. Lungs CTAB. Musculoskeletal: Examination of the back there is no gross deformity and no point tenderness on palpation of the thoracic or lumbar spine.  There is however tenderness on the right SI joint area and surrounding tissue.  Patient is able to ambulate without any assistance.  She is able to get from a sitting position to standing without any assistance.  Normal gait was noted. Neurologic:  Normal speech and language. No gross focal neurologic deficits are appreciated. No gait instability. Skin:  Skin is warm, dry.  The healing wound on her upper back is without evidence of infection.  Skin is soft and nontender surrounding the area.  There is no drainage and the skin continues to have very superficial opening which appears to be dry skin.  No warmth or abscess is appreciated.  Skin is soft in this area.  Psychiatric: Mood and affect are normal. Speech and behavior are normal.  ____________________________________________   LABS (all labs ordered are listed, but only abnormal results are displayed)  Labs Reviewed  CBC WITH DIFFERENTIAL/PLATELET - Abnormal; Notable for the following components:      Result Value   Hemoglobin 15.7 (*)    All other components within normal limits  BASIC METABOLIC PANEL - Abnormal; Notable for the following components:   Potassium 3.4 (*)    CO2 21 (*)    Glucose, Bld 106 (*)    All other components within normal limits    PROCEDURES  Procedure(s) performed (including Critical Care):  Procedures   ____________________________________________   INITIAL  IMPRESSION / ASSESSMENT AND PLAN / ED COURSE  As part of my medical decision making, I reviewed the following data within the electronic MEDICAL RECORD NUMBER Notes from prior ED visits and [place] Controlled Substance Database  39 year old female presents to the ED for right sided sciatica that has flared up.  Patient states that she has had this before and this feels the same.  There is been no history of a recent injury.  She is also here because she was treated recently for a cat scratch to her upper back.  She was placed on 3 antibiotics including Bactrim, Zithromax and Cipro.  When she was examined today at the urgent care she was sent to  the emergency department for concerns of an epidural abscess.  Patient denied any fever, chills, nausea or vomiting.  She has no increase in pain with range of motion.  There is no point tenderness on palpation and no drainage was noted.  The area appears to be healing well without any continued infection.  Lab work was reassuring and patient was made aware that her white count is normal and that her potassium was 1/10 of a point from being normal which is most likely because of her hydrochlorothiazide.  She is encouraged to follow-up with her PCP as her blood pressure was elevated however she states that she was scared to death because of what she was told prior to coming to the ED.  A Toradol injection was given and patient will start on prednisone 6-day taper.  She is to return to the emergency department if any changes or worsening of her symptoms. ____________________________________________   FINAL CLINICAL IMPRESSION(S) / ED DIAGNOSES  Final diagnoses:  Sciatica of right side  Encounter for wound re-check  Elevated blood pressure reading     ED Discharge Orders         Ordered    predniSONE (DELTASONE) 10 MG tablet     10/30/19 1546           Note:  This document was prepared using Dragon voice recognition software and may include unintentional  dictation errors.    Tommi Rumps, PA-C 10/30/19 1633    Minna Antis, MD 10/31/19 2237

## 2019-10-30 NOTE — ED Notes (Signed)
Reference triage note. Pt appears uncomfortable while sitting in chair. Pt stated she noted back pain and leg pain flaring up after recently starting antibiotics.

## 2019-11-30 ENCOUNTER — Ambulatory Visit: Payer: BC Managed Care – PPO | Attending: Internal Medicine

## 2019-11-30 DIAGNOSIS — Z23 Encounter for immunization: Secondary | ICD-10-CM

## 2019-11-30 NOTE — Progress Notes (Signed)
   Covid-19 Vaccination Clinic  Name:  Cynthia Green    MRN: [id number] DOB: 1980-11-30  11/30/2019  [name] was observed post Covid-19 immunization for 15 minutes without incident. She was provided with Vaccine Information Sheet and instruction to access the V-Safe system.   [name] was instructed to call 911 with any severe reactions post vaccine: Marland Kitchen Difficulty breathing  . Swelling of face and throat  . A fast heartbeat  . A bad rash all over body  . Dizziness and weakness   Immunizations Administered    Name Date Dose VIS Date Route   Pfizer COVID-19 Vaccine 11/30/2019  2:09 PM 0.3 mL 08/23/2019 Intramuscular   Manufacturer: [employer], [employer]   Lot: [id number]   NDC: [address]-2

## 2019-12-31 ENCOUNTER — Ambulatory Visit: Payer: BC Managed Care – PPO | Attending: Internal Medicine

## 2019-12-31 DIAGNOSIS — Z23 Encounter for immunization: Secondary | ICD-10-CM

## 2019-12-31 NOTE — Progress Notes (Signed)
   Covid-19 Vaccination Clinic  Name:  Cynthia Green    MRN: [id number] DOB: Jul 31, 1981  12/31/2019  [name] was observed post Covid-19 immunization for 15 minutes without incident. She was provided with Vaccine Information Sheet and instruction to access the V-Safe system.   [name] was instructed to call 911 with any severe reactions post vaccine: Marland Kitchen Difficulty breathing  . Swelling of face and throat  . A fast heartbeat  . A bad rash all over body  . Dizziness and weakness   Immunizations Administered    Name Date Dose VIS Date Route   Pfizer COVID-19 Vaccine 12/31/2019  8:17 AM 0.3 mL 11/06/2018 Intramuscular   Manufacturer: [employer], [employer]   Lot: [id number]   NDC: [address]-9

## 2020-03-31 ENCOUNTER — Telehealth (INDEPENDENT_AMBULATORY_CARE_PROVIDER_SITE_OTHER): Payer: BC Managed Care – PPO | Admitting: Adult Health

## 2020-03-31 DIAGNOSIS — Z9989 Dependence on other enabling machines and devices: Secondary | ICD-10-CM | POA: Diagnosis not present

## 2020-03-31 DIAGNOSIS — G4733 Obstructive sleep apnea (adult) (pediatric): Secondary | ICD-10-CM

## 2020-03-31 NOTE — Progress Notes (Signed)
PATIENT: Cynthia Green DOB: 10/07/1980  REASON FOR VISIT: follow up HISTORY FROM: patient  Virtual Visit via Video Note  I connected with Cynthia Green on 03/31/20 at  8:00 AM EDT by a video enabled telemedicine application located remotely at [facility] and verified that I am speaking with the correct person using two identifiers who was located at their own home.   I discussed the limitations of evaluation and management by telemedicine and the availability of in person appointments. The patient expressed understanding and agreed to proceed.   PATIENT: Cynthia Green DOB: [birth year]  REASON FOR VISIT: follow up HISTORY FROM: patient  HISTORY OF PRESENT ILLNESS: Today 03/31/20:  Cynthia Green is a 39 year old female with a history of OSA on CPAP. She returns today for follow-up via Video vist. Her CPAP download show that she used her machine 30/30 days for compliance of 100%. She used her CPAP >4 hours each night for a compliance of 100%. On average she using her machine 8 hours 1 minute. Her residual AHI is 0.1 and 7-14 cm H20 with EPR 3. She reports that initially her fatigue was well treated but it has returned. She works for home. Not very physically active d/t pinched nerve in the back. Just recently started walking daily. Does see a psychiatrist who tried armodafinil but reports that he caused brain fog. Now she is alternating between [facility] an adderrall. She just started that this week but has seen the benefit.   HISTORY 04/02/19:  Cynthia Green is a 39 year old female with a history of obstructive sleep apnea on CPAP.  She joins me today for virtual visit.  Her download indicates that she use her machine nightly for compliance of 100%.  She used her machine greater than 4 hours 29 days for compliance of 97%.  On average she uses her machine 8 hours and 32 minutes.  Her residual AHI is 0.2 on 7 to 14 cm of water with EPR of 3.  Her leak in the  95th percentile is 7.2 L/min.  She states that she is doing well on the CPAP.  She states that she is not having daytime sleepiness like she was.  Reports that she is not having to take naps throughout the day.  She joins me today for virtual visit.  REVIEW OF SYSTEMS: Out of a complete 14 system review of symptoms, the patient complains only of the following symptoms, and all other reviewed systems are negative.  See HPI  ALLERGIES: Allergies  Allergen Reactions  . Morphine And Related Rash    HOME MEDICATIONS: Outpatient Medications Prior to Visit  Medication Sig Dispense Refill  . carbamazepine (EQUETRO) 200 MG CP12 12 hr capsule Take 200 mg by mouth.    Marland Kitchen FLUoxetine (PROZAC) 20 MG capsule Take 20 mg by mouth daily.    . hydrochlorothiazide (HYDRODIURIL) 25 MG tablet Take 25 mg by mouth daily.    Marland Kitchen lamoTRIgine (LAMICTAL) 200 MG tablet Take 200 mg by mouth daily.    Marland Kitchen levothyroxine (SYNTHROID, LEVOTHROID) 88 MCG tablet Take 88 mcg by mouth daily before breakfast.    . predniSONE (DELTASONE) 10 MG tablet Take 6 tablets  today, on day 2 take 5 tablets, day 3 take 4 tablets, day 4 take 3 tablets, day 5 take  2 tablets and 1 tablet the last day 21 tablet 0  . topiramate (TOPAMAX) 50 MG tablet Take 50 mg by mouth.    . vitamin  B-12 (CYANOCOBALAMIN) 1000 MCG tablet Take 1,000 mcg by mouth daily.    Marland Kitchen VITAMIN D PO Take 2,000 Units by mouth daily.     No facility-administered medications prior to visit.    PAST MEDICAL HISTORY: Past Medical History:  Diagnosis Date  . Chronic sciatica of left side 10/27/2015  . Fatigue   . GERD (gastroesophageal reflux disease)   . Hypertension   . Hypothyroid   . Migraine   . Mood disorder ([facility])   . Morbid obesity ([facility])   . Vitamin D deficiency     PAST SURGICAL HISTORY: No past surgical history on file.  FAMILY HISTORY: No family history on file.  SOCIAL HISTORY: Social History   Socioeconomic History  . Marital status: Single     Spouse name: Not on file  . Number of children: Not on file  . Years of education: Not on file  . Highest education level: Not on file  Occupational History  . Not on file  Tobacco Use  . Smoking status: Never Smoker  . Smokeless tobacco: Never Used  Substance and Sexual Activity  . Alcohol use: Yes  . Drug use: Not on file  . Sexual activity: Not on file  Other Topics Concern  . Not on file  Social History Narrative  . Not on file   Social Determinants of Health   Financial Resource Strain:   . Difficulty of Paying Living Expenses:   Food Insecurity:   . Worried About Programme researcher, broadcasting/film/video in the Last Year:   . Barista in the Last Year:   Transportation Needs:   . Freight forwarder (Medical):   Marland Kitchen Lack of Transportation (Non-Medical):   Physical Activity:   . Days of Exercise per Week:   . Minutes of Exercise per Session:   Stress:   . Feeling of Stress :   Social Connections:   . Frequency of Communication with Friends and Family:   . Frequency of Social Gatherings with Friends and Family:   . Attends Religious Services:   . Active Member of Clubs or Organizations:   . Attends Banker Meetings:   Marland Kitchen Marital Status:   Intimate Partner Violence:   . Fear of Current or Ex-Partner:   . Emotionally Abused:   Marland Kitchen Physically Abused:   . Sexually Abused:       PHYSICAL EXAM Generalized: Well developed, in no acute distress   Neurological examination  Mentation: Alert oriented to time, place, history taking. Follows all commands speech and language fluent Cranial nerve II-XII:Extraocular movements were full. Facial symmetry noted. uvula tongue midline. Head turning and shoulder shrug  were normal and symmetric. Motor: Good strength throughout subjectively per patient Sensory: Sensory testing is intact to soft touch on all 4 extremities subjectively per patient Coordination: Cerebellar testing reveals good finger-nose-finger  Gait and station:  Patient is able to stand from a seated position. gait is normal.  Reflexes: UTA  DIAGNOSTIC DATA (LABS, IMAGING, TESTING) - I reviewed patient records, labs, notes, testing and imaging myself where available.  Lab Results  Component Value Date   WBC 10.1 10/30/2019   HGB 15.7 (H) 10/30/2019   HCT 46.0 10/30/2019   MCV 91.3 10/30/2019   PLT 330 10/30/2019      Component Value Date/Time   NA 141 10/30/2019 1453   K 3.4 (L) 10/30/2019 1453   CL 109 10/30/2019 1453   CO2 21 (L) 10/30/2019 1453   GLUCOSE 106 (H) 10/30/2019  1453   BUN 15 10/30/2019 1453   CREATININE 0.84 10/30/2019 1453   CALCIUM 9.2 10/30/2019 1453   GFRNONAA >60 10/30/2019 1453   GFRAA >60 10/30/2019 1453      ASSESSMENT AND PLAN 39 y.o. year old female  has a past medical history of Chronic sciatica of left side (10/27/2015), Fatigue, GERD (gastroesophageal reflux disease), Hypertension, Hypothyroid, Migraine, Mood disorder ([facility]), Morbid obesity ([facility]), and Vitamin D deficiency. here with:  OSA on CPAP  . CPAP compliance excellent . Residual AHI is good . Encouraged patient to continue using CPAP nightly and > 4 hours each night . F/U in 1 year or sooner if needed  I spent 20 minutes of face-to-face and non-face-to-face time with patient.  This included previsit chart review, lab review, study review, order entry, electronic health record documentation, patient education.  Butch Penny, MSN, NP-C 03/31/2020, 8:01 AM [facility] [address] [place], [place] [address] [phone]

## 2020-09-23 ENCOUNTER — Encounter: Payer: Self-pay | Admitting: Adult Health

## 2021-04-06 ENCOUNTER — Telehealth: Payer: BC Managed Care – PPO | Admitting: Adult Health

## 2021-04-13 ENCOUNTER — Telehealth: Payer: BC Managed Care – PPO | Admitting: Adult Health

## 2021-04-13 ENCOUNTER — Encounter: Payer: Self-pay | Admitting: *Deleted

## 2021-04-13 DIAGNOSIS — Z9989 Dependence on other enabling machines and devices: Secondary | ICD-10-CM

## 2021-04-13 DIAGNOSIS — G4733 Obstructive sleep apnea (adult) (pediatric): Secondary | ICD-10-CM | POA: Diagnosis not present

## 2021-04-13 NOTE — Progress Notes (Addendum)
PATIENT: Cynthia Green DOB: 04-03-1981  REASON FOR VISIT: follow up HISTORY FROM: patient  Virtual Visit via Video Note  I connected with Cynthia Green on 04/13/21 at  8:00 AM EDT by a video enabled telemedicine application located remotely at [facility] and verified that I am speaking with the correct person using two identifiers who was located at their own home.   I discussed the limitations of evaluation and management by telemedicine and the availability of in person appointments. The patient expressed understanding and agreed to proceed.   PATIENT: Cynthia Green DOB: [birth year]  REASON FOR VISIT: follow up HISTORY FROM: patient Primary neurologist: Dr. Frances Furbish   HISTORY OF PRESENT ILLNESS: Today 04/13/21:  Ms. Dufrane is a 40 year old female with a history of obstructive sleep apnea on CPAP.  She returns today for follow-up.  Her download indicates good compliance.  She denies any new issues.  Reports that the CPAP is working well for her.  Reports that she had bariatric surgery several months ago.  She states that since last August she has lost approximately 130 pounds.  She plans to lose more weight.    03/31/20: Ms. Devoss is a 40 year old female with a history of OSA on CPAP. She returns today for follow-up via Video vist. Her CPAP download show that she used her machine 30/30 days for compliance of 100%. She used her CPAP >4 hours each night for a compliance of 100%. On average she using her machine 8 hours 1 minute. Her residual AHI is 0.1 and 7-14 cm H20 with EPR 3. She reports that initially her fatigue was well treated but it has returned. She works for home. Not very physically active d/t pinched nerve in the back. Just recently started walking daily. Does see a psychiatrist who tried armodafinil but reports that he caused brain fog. Now she is alternating between [facility] an adderrall. She just started that this week but has seen the  benefit.   HISTORY 04/02/19:   Ms. Amburn is a 40 year old female with a history of obstructive sleep apnea on CPAP.  She joins me today for virtual visit.  Her download indicates that she use her machine nightly for compliance of 100%.  She used her machine greater than 4 hours 29 days for compliance of 97%.  On average she uses her machine 8 hours and 32 minutes.  Her residual AHI is 0.2 on 7 to 14 cm of water with EPR of 3.  Her leak in the 95th percentile is 7.2 L/min.  She states that she is doing well on the CPAP.  She states that she is not having daytime sleepiness like she was.  Reports that she is not having to take naps throughout the day.  She joins me today for virtual visit.  REVIEW OF SYSTEMS: Out of a complete 14 system review of symptoms, the patient complains only of the following symptoms, and all other reviewed systems are negative.  ESS 3  ALLERGIES: Allergies  Allergen Reactions   Morphine And Related Rash    HOME MEDICATIONS: Outpatient Medications Prior to Visit  Medication Sig Dispense Refill   carbamazepine (EQUETRO) 200 MG CP12 12 hr capsule Take 200 mg by mouth.     FLUoxetine (PROZAC) 20 MG capsule Take 20 mg by mouth daily.     hydrochlorothiazide (HYDRODIURIL) 25 MG tablet Take 25 mg by mouth daily.     lamoTRIgine (LAMICTAL) 200 MG tablet Take 200 mg  by mouth daily.     levothyroxine (SYNTHROID, LEVOTHROID) 88 MCG tablet Take 88 mcg by mouth daily before breakfast.     predniSONE (DELTASONE) 10 MG tablet Take 6 tablets  today, on day 2 take 5 tablets, day 3 take 4 tablets, day 4 take 3 tablets, day 5 take  2 tablets and 1 tablet the last day 21 tablet 0   topiramate (TOPAMAX) 50 MG tablet Take 50 mg by mouth.     vitamin B-12 (CYANOCOBALAMIN) 1000 MCG tablet Take 1,000 mcg by mouth daily.     VITAMIN D PO Take 2,000 Units by mouth daily.     No facility-administered medications prior to visit.    PAST MEDICAL HISTORY: Past Medical History:  Diagnosis  Date   Chronic sciatica of left side 10/27/2015   Fatigue    GERD (gastroesophageal reflux disease)    Hypertension    Hypothyroid    Migraine    Mood disorder (HCC)    Morbid obesity (HCC)    Vitamin D deficiency     PAST SURGICAL HISTORY: No past surgical history on file.  FAMILY HISTORY: No family history on file.  SOCIAL HISTORY: Social History   Socioeconomic History   Marital status: Single    Spouse name: Not on file   Number of children: Not on file   Years of education: Not on file   Highest education level: Not on file  Occupational History   Not on file  Tobacco Use   Smoking status: Never   Smokeless tobacco: Never  Substance and Sexual Activity   Alcohol use: Yes   Drug use: Not on file   Sexual activity: Not on file  Other Topics Concern   Not on file  Social History Narrative   Not on file   Social Determinants of Health   Financial Resource Strain: Not on file  Food Insecurity: Not on file  Transportation Needs: Not on file  Physical Activity: Not on file  Stress: Not on file  Social Connections: Not on file  Intimate Partner Violence: Not on file      PHYSICAL EXAM Generalized: Well developed, in no acute distress   Neurological examination  Mentation: Alert oriented to time, place, history taking. Follows all commands speech and language fluent Cranial nerve II-XII:Extraocular movements were full. Facial symmetry noted. uvula tongue midline. Head turning and shoulder shrug  were normal and symmetric. Motor: Good strength throughout subjectively per patient Sensory: Sensory testing is intact to soft touch on all 4 extremities subjectively per patient Coordination: Cerebellar testing reveals good finger-nose-finger  Gait and station: Patient is able to stand from a seated position. gait is normal.  Reflexes: UTA  DIAGNOSTIC DATA (LABS, IMAGING, TESTING) - I reviewed patient records, labs, notes, testing and imaging myself where  available.  Lab Results  Component Value Date   WBC 10.1 10/30/2019   HGB 15.7 (H) 10/30/2019   HCT 46.0 10/30/2019   MCV 91.3 10/30/2019   PLT 330 10/30/2019      Component Value Date/Time   NA 141 10/30/2019 1453   K 3.4 (L) 10/30/2019 1453   CL 109 10/30/2019 1453   CO2 21 (L) 10/30/2019 1453   GLUCOSE 106 (H) 10/30/2019 1453   BUN 15 10/30/2019 1453   CREATININE 0.84 10/30/2019 1453   CALCIUM 9.2 10/30/2019 1453   GFRNONAA >60 10/30/2019 1453   GFRAA >60 10/30/2019 1453      ASSESSMENT AND PLAN 39 y.o. year old female  has  a past medical history of Chronic sciatica of left side (10/27/2015), Fatigue, GERD (gastroesophageal reflux disease), Hypertension, Hypothyroid, Migraine, Mood disorder (HCC), Morbid obesity (HCC), and Vitamin D deficiency. here with:  OSA on CPAP  CPAP compliance slightly suboptimal Residual AHI is good Encouraged patient to continue using CPAP nightly and > 4 hours each night Advised that in the future we can repeat home sleep test to see if she still has sleep apnea due to weight loss F/U in 1 year or sooner if needed    Butch Penny, MSN, NP-C 04/13/2021, 8:07 AM [facility] [address] [place], [place] [address] [phone]  I reviewed the above note and documentation by the Nurse Practitioner and agree with the history, exam, assessment and plan as outlined above. I was available for consultation. Huston Foley, MD, PhD [facility])

## 2021-04-14 ENCOUNTER — Other Ambulatory Visit: Payer: Self-pay

## 2022-04-19 ENCOUNTER — Telehealth: Payer: BC Managed Care – PPO | Admitting: Adult Health

## 2022-05-13 ENCOUNTER — Telehealth: Payer: BC Managed Care – PPO | Admitting: Adult Health

## 2022-05-13 DIAGNOSIS — Z9989 Dependence on other enabling machines and devices: Secondary | ICD-10-CM

## 2022-05-13 DIAGNOSIS — G4733 Obstructive sleep apnea (adult) (pediatric): Secondary | ICD-10-CM

## 2022-05-13 NOTE — Progress Notes (Signed)
PATIENT: Cynthia Green DOB: 09-11-1981  REASON FOR VISIT: follow up HISTORY FROM: patient  Virtual Visit via Video Note  I connected with Cynthia Green on 05/13/22 at  9:30 AM EDT by a video enabled telemedicine application located remotely at [facility] and verified that I am speaking with the correct person using two identifiers who was located at their own home.   I discussed the limitations of evaluation and management by telemedicine and the availability of in person appointments. The patient expressed understanding and agreed to proceed.   PATIENT: Cynthia Green DOB: 08-31-1981  REASON FOR VISIT: follow up HISTORY FROM: patient Primary neurologist: Dr. Frances Furbish   HISTORY OF PRESENT ILLNESS: Today 05/13/22:  Ms. Bobrowski is a 41 year old female with a history of obstructive sleep apnea on CPAP.  She returns today for follow-up. She denies any new issues.  Has lost approximately 130 pounds.  She weighed 421 pounds when she did her original sleep test.  She reports today that she weighs 260lbs. Psychiatrist is giving adderall and modafinil reports that she continues to have fatigue.  Although it initially got better with CPAP it has continued.  She has never had narcolepsy testing.  Reports that her psychiatrist thinks that she may have narcolepsy.  She returns today for an evaluation.  Her download indicates that she used her machine 52 out of the last 90 days for compliance of 58%.  She uses her machine greater than 4 hours each night.  On average she uses her machine 6 hours and 50 minutes.  Her residual AHI is 0.1   04/13/21: Ms. Laubach is a 40 year old female with a history of obstructive sleep apnea on CPAP.  She returns today for follow-up.  Her download indicates good compliance.  She denies any new issues.  Reports that the CPAP is working well for her.  Reports that she had bariatric surgery several months ago.  She states that since last  August she has lost approximately 130 pounds.  She plans to lose more weight.    03/31/20: Ms. Boda is a 41 year old female with a history of OSA on CPAP. She returns today for follow-up via Video vist. Her CPAP download show that she used her machine 30/30 days for compliance of 100%. She used her CPAP >4 hours each night for a compliance of 100%. On average she using her machine 8 hours 1 minute. Her residual AHI is 0.1 and 7-14 cm H20 with EPR 3. She reports that initially her fatigue was well treated but it has returned. She works for home. Not very physically active d/t pinched nerve in the back. Just recently started walking daily. Does see a psychiatrist who tried armodafinil but reports that he caused brain fog. Now she is alternating between [facility] an adderrall. She just started that this week but has seen the benefit.   HISTORY 04/02/19:   Ms. Resendes is a 41 year old female with a history of obstructive sleep apnea on CPAP.  She joins me today for virtual visit.  Her download indicates that she use her machine nightly for compliance of 100%.  She used her machine greater than 4 hours 29 days for compliance of 97%.  On average she uses her machine 8 hours and 32 minutes.  Her residual AHI is 0.2 on 7 to 14 cm of water with EPR of 3.  Her leak in the 95th percentile is 7.2 L/min.  She states that she is doing  well on the CPAP.  She states that she is not having daytime sleepiness like she was.  Reports that she is not having to take naps throughout the day.  She joins me today for virtual visit.  REVIEW OF SYSTEMS: Out of a complete 14 system review of symptoms, the patient complains only of the following symptoms, and all other reviewed systems are negative.  ESS 3  ALLERGIES: Allergies  Allergen Reactions   Morphine And Related Rash    HOME MEDICATIONS: Outpatient Medications Prior to Visit  Medication Sig Dispense Refill   carbamazepine (EQUETRO) 200 MG CP12 12 hr capsule Take  200 mg by mouth.     FLUoxetine (PROZAC) 20 MG capsule Take 20 mg by mouth daily.     hydrochlorothiazide (HYDRODIURIL) 25 MG tablet Take 25 mg by mouth daily.     lamoTRIgine (LAMICTAL) 200 MG tablet Take 200 mg by mouth daily.     levothyroxine (SYNTHROID, LEVOTHROID) 88 MCG tablet Take 88 mcg by mouth daily before breakfast.     predniSONE (DELTASONE) 10 MG tablet Take 6 tablets  today, on day 2 take 5 tablets, day 3 take 4 tablets, day 4 take 3 tablets, day 5 take  2 tablets and 1 tablet the last day 21 tablet 0   topiramate (TOPAMAX) 50 MG tablet Take 50 mg by mouth.     vitamin B-12 (CYANOCOBALAMIN) 1000 MCG tablet Take 1,000 mcg by mouth daily.     VITAMIN D PO Take 2,000 Units by mouth daily.     No facility-administered medications prior to visit.    PAST MEDICAL HISTORY: Past Medical History:  Diagnosis Date   Chronic sciatica of left side 10/27/2015   Fatigue    GERD (gastroesophageal reflux disease)    Hypertension    Hypothyroid    Migraine    Mood disorder (HCC)    Morbid obesity (HCC)    Vitamin D deficiency     PAST SURGICAL HISTORY: No past surgical history on file.  FAMILY HISTORY: No family history on file.  SOCIAL HISTORY: Social History   Socioeconomic History   Marital status: Single    Spouse name: Not on file   Number of children: Not on file   Years of education: Not on file   Highest education level: Not on file  Occupational History   Not on file  Tobacco Use   Smoking status: Never   Smokeless tobacco: Never  Substance and Sexual Activity   Alcohol use: Yes   Drug use: Not on file   Sexual activity: Not on file  Other Topics Concern   Not on file  Social History Narrative   Not on file   Social Determinants of Health   Financial Resource Strain: Not on file  Food Insecurity: Not on file  Transportation Needs: Not on file  Physical Activity: Not on file  Stress: Not on file  Social Connections: Not on file  Intimate Partner  Violence: Not on file      PHYSICAL EXAM Generalized: Well developed, in no acute distress   Neurological examination  Mentation: Alert oriented to time, place, history taking. Follows all commands speech and language fluent Cranial nerve II-XII:Extraocular movements were full. Facial symmetry noted. uvula tongue midline. Head turning and shoulder shrug  were normal and symmetric. Motor: Good strength throughout subjectively per patient Sensory: Sensory testing is intact to soft touch on all 4 extremities subjectively per patient Coordination: Cerebellar testing reveals good finger-nose-finger  Gait and station:  Patient is able to stand from a seated position. gait is normal.  Reflexes: UTA  DIAGNOSTIC DATA (LABS, IMAGING, TESTING) - I reviewed patient records, labs, notes, testing and imaging myself where available.  Lab Results  Component Value Date   WBC 10.1 10/30/2019   HGB 15.7 (H) 10/30/2019   HCT 46.0 10/30/2019   MCV 91.3 10/30/2019   PLT 330 10/30/2019      Component Value Date/Time   NA 141 10/30/2019 1453   K 3.4 (L) 10/30/2019 1453   CL 109 10/30/2019 1453   CO2 21 (L) 10/30/2019 1453   GLUCOSE 106 (H) 10/30/2019 1453   BUN 15 10/30/2019 1453   CREATININE 0.84 10/30/2019 1453   CALCIUM 9.2 10/30/2019 1453   GFRNONAA >60 10/30/2019 1453   GFRAA >60 10/30/2019 1453      ASSESSMENT AND PLAN 41 y.o. year old female  has a past medical history of Chronic sciatica of left side (10/27/2015), Fatigue, GERD (gastroesophageal reflux disease), Hypertension, Hypothyroid, Migraine, Mood disorder (HCC), Morbid obesity (HCC), and Vitamin D deficiency. here with:  OSA on CPAP  CPAP compliance slightly suboptimal Residual AHI is good Encouraged patient to continue using CPAP nightly and > 4 hours each night Home sleep test ordered due to significant weight loss.  Advised that I would discuss narcolepsy testing with Dr. Frances Furbish. F/U after home sleep test    Butch Penny, MSN, NP-C 05/13/2022, 9:34 AM [facility] [address], [address] [place], [place] [address] [phone]

## 2022-05-23 ENCOUNTER — Encounter: Payer: Self-pay | Admitting: Adult Health

## 2022-05-31 ENCOUNTER — Telehealth: Payer: Self-pay | Admitting: Neurology

## 2022-05-31 NOTE — Telephone Encounter (Signed)
MAIL OUT HST- [employer] Josem Kaufmann: [phone] (exp.05/30/22 to 07/28/22).  Patient is aware to not throw device away until she we reach out to her.

## 2022-06-01 ENCOUNTER — Ambulatory Visit: Payer: BC Managed Care – PPO | Admitting: Neurology

## 2022-06-01 DIAGNOSIS — G4733 Obstructive sleep apnea (adult) (pediatric): Secondary | ICD-10-CM

## 2022-06-01 NOTE — Telephone Encounter (Signed)
MAIL OUT HST- [employer] Josem Kaufmann: [phone] (exp.05/30/22 to 07/28/22).   Patient is aware to not throw device away until we reach out to her to let her aware we have enough data on the study.

## 2022-06-08 NOTE — Procedures (Signed)
   [facility]  HOME SLEEP TEST (Watch PAT) REPORT  STUDY DATE: 06/05/22  DOB: 23-Mar-1981  MRN: [id number]  ORDERING CLINICIAN: Star Age, MD, PhD   REFERRING CLINICIAN: Ward Givens, NP  CLINICAL INFORMATION/HISTORY: 41 year old female with an underlying medical history of reflux disease, hypothyroidism, migraine headaches, mood disorder, hypertension, vitamin D deficiency and obesity, who presents for reevaluation of her obstructive sleep apnea.  She was diagnosed with severe obstructive sleep apnea in 2020.  She has had significant interim weight loss of over 100 pounds by self-report.  She has been on AutoPap therapy.  Epworth sleepiness score: 11/24.  Weight: 260 lb by self-report.  FINDINGS:   Sleep Summary:   Total Recording Time (hours, min): 9 hours, 46 min  Total Sleep Time (hours, min):  9 hours, 0 min  Percent REM (%):    24.4%   Respiratory Indices:   Calculated pAHI (per hour):  18.7/hour         REM pAHI:    27.6/hour       NREM pAHI: 15.6/hour  Central pAHI: 1.7/hour  Oxygen Saturation Statistics:    Oxygen Saturation (%) Mean: 94%   Minimum oxygen saturation (%):                 77%   O2 Saturation Range (%): 77-98%    O2 Saturation (minutes) <=88%: 0.1 min  Pulse Rate Statistics:   Pulse Mean (bpm):    62/min    Pulse Range (45-99/min)   IMPRESSION: OSA (obstructive sleep apnea)   RECOMMENDATION:  This home sleep test demonstrates moderate obstructive sleep apnea with a total AHI of 18.7/hour and O2 nadir of 77%.  Mild to moderate snoring was detected, at times intermittent, at times in the louder range.  Ongoing treatment with a positive airway pressure (PAP) device such as autoPAP is recommended.  Alternative treatment options may include a dental device through dentistry or orthodontics in selected patients or Inspire (hypoglossal nerve stimulator) in carefully selected patients (meeting inclusion criteria).   Concomitant weight loss is recommended (where clinically appropriate). Please note that untreated obstructive sleep apnea may carry additional perioperative morbidity. Patients with significant obstructive sleep apnea should receive perioperative PAP therapy and the surgeons and particularly the anesthesiologist should be informed of the diagnosis and the severity of the sleep disordered breathing. The patient should be cautioned not to drive, work at heights, or operate dangerous or heavy equipment when tired or sleepy. Review and reiteration of good sleep hygiene measures should be pursued with any patient. Other causes of the patient's symptoms, including circadian rhythm disturbances, an underlying mood disorder, medication effect and/or an underlying medical problem cannot be ruled out based on this test. Clinical correlation is recommended. The patient and his referring provider will be notified of the test results. The patient will be seen in follow up in sleep clinic at [facility].  I certify that I have reviewed the raw data recording prior to the issuance of this report in accordance with the standards of the American Academy of Sleep Medicine (AASM).  INTERPRETING PHYSICIAN:   Star Age, MD, PhD  Board Certified in Neurology and Sleep Medicine  [facility] [address] [place], [place] [address] [phone]

## 2022-11-14 ENCOUNTER — Ambulatory Visit
Admission: EM | Admit: 2022-11-14 | Discharge: 2022-11-14 | Disposition: A | Discharge status: Home / Self Care | Payer: BC Managed Care – PPO

## 2022-11-14 DIAGNOSIS — L03011 Cellulitis of right finger: Secondary | ICD-10-CM

## 2022-11-14 DIAGNOSIS — B360 Pityriasis versicolor: Secondary | ICD-10-CM

## 2022-11-14 MED ORDER — CEPHALEXIN 500 MG PO CAPS: 500.0000 mg | ORAL_CAPSULE | Freq: Three times a day (TID) | ORAL | 0 refills | Status: AC

## 2022-11-14 MED ORDER — CLOTRIMAZOLE-BETAMETHASONE 1-0.05 % EX CREA: TOPICAL_CREAM | CUTANEOUS | 0 refills | Status: AC

## 2022-11-14 NOTE — ED Triage Notes (Signed)
Pt presents with painful red rash on her red hand x 2-3 weeks. Reports using hydrocortisone cream for 1 week with minimal relief.   Pt also has rash to the back of her neck.

## 2022-11-14 NOTE — Discharge Instructions (Signed)
Keflex 3 times a day for 7 days Topical antifungal/steroid cream to the rash on your neck as prescribed Follow-up with your PCP if your symptoms do not improve Please go to the ER for any worsening symptoms

## 2022-11-14 NOTE — ED Provider Notes (Signed)
[facility]    CSN: [id number] Arrival date & time: 11/14/22  1742      History   Chief Complaint Chief Complaint  Patient presents with   Rash    HPI Cynthia Green is a 42 y.o. female presents for evaluation of a rash.  Patient reports a pruritic rash on the back of her neck for a few weeks.  Denies any drainage, swelling, warmth of the area.  Did get new shampoo/conditioner few weeks ago but that does not have the rash on any other location.  In addition she reports a rash on her right index finger over the PIP joint.  States recently has become more red and warm and painful.  It is not pruritic.  No drainage but she does endorse some swelling.  No fevers or chills.  No history of MRSA.  No history of eczema.  She has been using OTC hydrocortisone cream to the finger without improvement.  No other concerns at this time.   Rash   Past Medical History:  Diagnosis Date   Chronic sciatica of left side 10/27/2015   Fatigue    GERD (gastroesophageal reflux disease)    Hypertension    Hypothyroid    Migraine    Mood disorder (HCC)    Morbid obesity ([place])    Vitamin D deficiency     Patient Active Problem List   Diagnosis Date Noted   Chronic sciatica of left side 10/27/2015    History reviewed. No pertinent surgical history.  OB History   No obstetric history on file.      Home Medications    Prior to Admission medications   Medication Sig Start Date End Date Taking? Authorizing Provider  amphetamine-dextroamphetamine (ADDERALL XR) 30 MG 24 hr capsule Take 30 mg by mouth daily.   Yes [provider]  Armodafinil 200 MG TABS Take by mouth.   Yes [provider]  baclofen (LIORESAL) 10 MG tablet Take 10 mg by mouth 3 (three) times daily.   Yes [provider]  cephALEXin (KEFLEX) 500 MG capsule Take 1 capsule (500 mg total) by mouth 3 (three) times daily for 7 days. 11/14/22 11/21/22 Yes Melynda Ripple, NP   clotrimazole-betamethasone (LOTRISONE) cream Apply to affected area 2 times daily prn 11/14/22  Yes Melynda Ripple, NP  FLUoxetine (PROZAC) 20 MG capsule Take 20 mg by mouth daily.   Yes [provider]  lamoTRIgine (LAMICTAL) 200 MG tablet Take 200 mg by mouth daily.   Yes [provider]  levothyroxine (SYNTHROID, LEVOTHROID) 88 MCG tablet Take 88 mcg by mouth daily before breakfast.   Yes [provider]  traZODone (DESYREL) 50 MG tablet Take 50 mg by mouth at bedtime.   Yes [provider]  VITAMIN D PO Take 2,000 Units by mouth daily.   Yes [provider]  zonisamide (ZONEGRAN) 25 MG capsule Take 25 mg by mouth daily.   Yes [provider]  carbamazepine (EQUETRO) 200 MG CP12 12 hr capsule Take 200 mg by mouth.    [provider]  hydrochlorothiazide (HYDRODIURIL) 25 MG tablet Take 25 mg by mouth daily.    [provider]  predniSONE (DELTASONE) 10 MG tablet Take 6 tablets  today, on day 2 take 5 tablets, day 3 take 4 tablets, day 4 take 3 tablets, day 5 take  2 tablets and 1 tablet the last day 10/30/19   Johnn Hai, PA-C  topiramate (TOPAMAX) 50 MG tablet  Take 50 mg by mouth.    [provider]  vitamin B-12 (CYANOCOBALAMIN) 1000 MCG tablet Take 1,000 mcg by mouth daily.    [provider]    Family History Family History  Problem Relation Age of Onset   Hyperlipidemia Mother    Thyroid disease Mother    Cirrhosis Mother     Social History Social History   Tobacco Use   Smoking status: Never   Smokeless tobacco: Never  Substance Use Topics   Alcohol use: Yes     Allergies   Morphine and related and Azithromycin   Review of Systems Review of Systems  Skin:  Positive for rash.     Physical Exam Triage Vital Signs ED Triage Vitals  Enc Vitals Group     BP 11/14/22 1815 109/77     Pulse Rate 11/14/22 1815 94     Resp 11/14/22 1815 18     Temp 11/14/22 1815 98.6 F (37  C)     Temp src --      SpO2 11/14/22 1815 98 %     Weight --      Height --      Head Circumference --      Peak Flow --      Pain Score 11/14/22 1810 1     Pain Loc --      Pain Edu? --      Excl. in [place]? --    No data found.  Updated Vital Signs BP 109/77   Pulse 94   Temp 98.6 F (37 C)   Resp 18   LMP 11/14/2022   SpO2 98%   Visual Acuity Right Eye Distance:   Left Eye Distance:   Bilateral Distance:    Right Eye Near:   Left Eye Near:    Bilateral Near:     Physical Exam Vitals and nursing note reviewed.  Constitutional:      Appearance: Normal appearance.  HENT:     Head: Normocephalic and atraumatic.  Eyes:     Pupils: Pupils are equal, round, and reactive to light.  Cardiovascular:     Rate and Rhythm: Normal rate.  Pulmonary:     Effort: Pulmonary effort is normal.  Skin:    General: Skin is warm and dry.     Comments: Mildly erythematous thin plaques with central clearing on back of neck.  No swelling or drainage.  Dry cracked skin over the right index finger at the PIP joint.  Mild swelling with warmth and erythema.  No pain along the volar aspect of the finger.  Full extension and flexion without restriction or pain.  Refill +2  Neurological:     General: No focal deficit present.     Mental Status: She is alert and oriented to person, place, and time.  Psychiatric:        Mood and Affect: Mood normal.        Behavior: Behavior normal.      UC Treatments / Results  Labs (all labs ordered are listed, but only abnormal results are displayed) Labs Reviewed - No data to display  EKG   Radiology No results found.  Procedures Procedures (including critical care time)  Medications Ordered in UC Medications - No data to display  Initial Impression / Assessment and Plan / UC Course  I have reviewed the triage vital signs and the nursing notes.  Pertinent labs & imaging results that were available during my care of the patient  were  reviewed by me and considered in my medical decision making (see chart for details).     Reviewed exam and symptoms with patient Rash on neck more consistent with fungal, start betamethasone/clotrimazole lotion Discussed finger appears more consistent with cellulitis.  Start Keflex and advised to keep clean and dry Follow-up with PCP if symptoms do not improve ER precautions reviewed and patient verbalized understanding Final Clinical Impressions(s) / UC Diagnoses   Final diagnoses:  Tinea versicolor  Cellulitis of right index finger     Discharge Instructions      Keflex 3 times a day for 7 days Topical antifungal/steroid cream to the rash on your neck as prescribed Follow-up with your PCP if your symptoms do not improve Please go to the ER for any worsening symptoms   ED Prescriptions     Medication Sig Dispense Auth. Provider   cephALEXin (KEFLEX) 500 MG capsule Take 1 capsule (500 mg total) by mouth 3 (three) times daily for 7 days. 21 capsule Melynda Ripple, NP   clotrimazole-betamethasone (LOTRISONE) cream Apply to affected area 2 times daily prn 45 g Melynda Ripple, NP      PDMP not reviewed this encounter.   Melynda Ripple, NP 11/14/22 [phone]

## 2022-12-11 ENCOUNTER — Encounter: Payer: Self-pay | Admitting: Emergency Medicine

## 2022-12-11 ENCOUNTER — Ambulatory Visit
Admission: EM | Admit: 2022-12-11 | Discharge: 2022-12-11 | Disposition: A | Discharge status: Home / Self Care | Payer: BC Managed Care – PPO | Attending: Physician Assistant | Admitting: Physician Assistant

## 2022-12-11 DIAGNOSIS — L539 Erythematous condition, unspecified: Secondary | ICD-10-CM | POA: Diagnosis not present

## 2022-12-11 DIAGNOSIS — M2550 Pain in unspecified joint: Secondary | ICD-10-CM | POA: Insufficient documentation

## 2022-12-11 LAB — SEDIMENTATION RATE: Sed Rate: 5 mm/hr (ref 0–20)

## 2022-12-11 MED ORDER — METHYLPREDNISOLONE 4 MG PO TBPK: ORAL_TABLET | ORAL | 0 refills | Status: DC

## 2022-12-11 NOTE — ED Triage Notes (Signed)
Patient reports for the past 2 weeks she has been having pain and some itching in the joints of her fingers in both hands.

## 2022-12-11 NOTE — ED Provider Notes (Signed)
[facility]    CSN: [id number] Arrival date & time: 12/11/22  1454      History   Chief Complaint Chief Complaint  Patient presents with   Hand Pain    HPI Cynthia Green is a 42 y.o. female presenting for pain and swelling of bilateral IP joints of the thumbs for the past 2 weeks.  Denies any sort of injury or overuse.  She says sometimes the skin looks red at times.  She does have increased pain when she is bending her thumbs but is able to do so.  She also reports that she has had potentially similar problems in other fingers of her hand.  States she was seen back in January for pain and swelling of the joint of the right middle finger palmar PIP joint.  Reports being prescribed Medrol and says it was helpful.  Additionally she was seen earlier this month for pain, erythema and swelling of the right index finger about the palmar PIP joint.  At that time she was thought to have infected dermatitis and prescribed Keflex.  She says it has improved but she still notes a little discoloration.  Denies any associated pain in any other fingers at this time other than her thumbs.  Has tried Tylenol.  Denies numbness, weakness or tingling.  States she has no known history of gout, arthritis, autoimmune or inflammatory disease.  Medical history significant for hypothyroidism, hypertension and she is status post gastric bypass.  HPI  Past Medical History:  Diagnosis Date   Chronic sciatica of left side 10/27/2015   Fatigue    GERD (gastroesophageal reflux disease)    Hypertension    Hypothyroid    Migraine    Mood disorder (HCC)    Morbid obesity (HCC)    Vitamin D deficiency     Patient Active Problem List   Diagnosis Date Noted   Chronic sciatica of left side 10/27/2015    History reviewed. No pertinent surgical history.  OB History   No obstetric history on file.      Home Medications    Prior to Admission medications   Medication Sig Start Date End Date  Taking? Authorizing Provider  methylPREDNISolone (MEDROL DOSEPAK) 4 MG TBPK tablet Take according to dosepack instructions 12/11/22  Yes Danton Clap, PA-C  amphetamine-dextroamphetamine (ADDERALL XR) 30 MG 24 hr capsule Take 30 mg by mouth daily.    [provider]  Armodafinil 200 MG TABS Take by mouth.    [provider]  baclofen (LIORESAL) 10 MG tablet Take 10 mg by mouth 3 (three) times daily.    [provider]  carbamazepine (EQUETRO) 200 MG CP12 12 hr capsule Take 200 mg by mouth.    [provider]  clotrimazole-betamethasone (LOTRISONE) cream Apply to affected area 2 times daily prn 11/14/22   Melynda Ripple, NP  FLUoxetine (PROZAC) 20 MG capsule Take 20 mg by mouth daily.    [provider]  hydrochlorothiazide (HYDRODIURIL) 25 MG tablet Take 25 mg by mouth daily.    [provider]  lamoTRIgine (LAMICTAL) 200 MG tablet Take 200 mg by mouth daily.    [provider]  levothyroxine (SYNTHROID, LEVOTHROID) 88 MCG tablet Take 88 mcg by mouth daily before breakfast.    [provider]  predniSONE (DELTASONE) 10 MG tablet Take 6 tablets  today, on day 2 take 5 tablets, day 3 take 4 tablets, day 4 take 3 tablets, day 5 take  2 tablets  and 1 tablet the last day 10/30/19   Johnn Hai, PA-C  topiramate (TOPAMAX) 50 MG tablet Take 50 mg by mouth.    [provider]  traZODone (DESYREL) 50 MG tablet Take 50 mg by mouth at bedtime.    [provider]  vitamin B-12 (CYANOCOBALAMIN) 1000 MCG tablet Take 1,000 mcg by mouth daily.    [provider]  VITAMIN D PO Take 2,000 Units by mouth daily.    [provider]  zonisamide (ZONEGRAN) 25 MG capsule Take 25 mg by mouth daily.    [provider]    Family History Family History  Problem Relation Age of Onset   Hyperlipidemia Mother    Thyroid disease Mother    Cirrhosis Mother     Social History Social History   Tobacco  Use   Smoking status: Never   Smokeless tobacco: Never  Vaping Use   Vaping Use: Never used  Substance Use Topics   Alcohol use: Yes     Allergies   Morphine and related and Azithromycin   Review of Systems Review of Systems  Musculoskeletal:  Positive for arthralgias and joint swelling.  Skin:  Positive for color change. Negative for rash.  Neurological:  Negative for weakness and numbness.     Physical Exam Triage Vital Signs ED Triage Vitals  Enc Vitals Group     BP      Pulse      Resp      Temp      Temp src      SpO2      Weight      Height      Head Circumference      Peak Flow      Pain Score      Pain Loc      Pain Edu?      Excl. in [place]?    No data found.  Updated Vital Signs BP (!) 146/80 (BP Location: Left Arm)   Pulse 89   Temp 98.5 F (36.9 C) (Oral)   Resp 14   Ht 5\' 5"  (1.651 m)   Wt (!) 421 lb 1.3 oz (191 kg)   LMP 11/14/2022   SpO2 96%   BMI 70.07 kg/m      Physical Exam Vitals and nursing note reviewed.  Constitutional:      General: She is not in acute distress.    Appearance: Normal appearance. She is not ill-appearing or toxic-appearing.  HENT:     Head: Normocephalic and atraumatic.  Eyes:     General: No scleral icterus.       Right eye: No discharge.        Left eye: No discharge.     Conjunctiva/sclera: Conjunctivae normal.  Cardiovascular:     Rate and Rhythm: Normal rate and regular rhythm.     Pulses: Normal pulses.     Heart sounds: Normal heart sounds.  Pulmonary:     Effort: Pulmonary effort is normal. No respiratory distress.     Breath sounds: Normal breath sounds.  Musculoskeletal:     Cervical back: Neck supple.     Comments: Right hand: Of the palmar aspect of the right hand there is mildly increased erythema about the PIP joints of the index finger and middle finger.  The joints are nontender.  Full range of motion of joints.  There is mild increased swelling of the IP joint of the thumb and tenderness  of this joint.  Full range of motion of joint.  Left hand: Mild increased swelling of the IP joint of the thumb and tenderness of this joint.  Full range of motion of thumb joints.  Skin:    General: Skin is dry.  Neurological:     General: No focal deficit present.     Mental Status: She is alert. Mental status is at baseline.     Motor: No weakness.     Gait: Gait normal.  Psychiatric:        Mood and Affect: Mood normal.        Behavior: Behavior normal.        Thought Content: Thought content normal.      UC Treatments / Results  Labs (all labs ordered are listed, but only abnormal results are displayed) Labs Reviewed  C-REACTIVE PROTEIN  SEDIMENTATION RATE  ANA W/REFLEX IF POSITIVE  RHEUMATOID FACTOR    EKG   Radiology No results found.  Procedures Procedures (including critical care time)  Medications Ordered in UC Medications - No data to display  Initial Impression / Assessment and Plan / UC Course  I have reviewed the triage vital signs and the nursing notes.  Pertinent labs & imaging results that were available during my care of the patient were reviewed by me and considered in my medical decision making (see chart for details).   42 year old female presents for concerns about polyarthralgia.  Over the past 2 weeks she has had pain of the IP joints of both of her thumbs.  No known injury.  Additionally she reports that she had redness, swelling and pain of her index finger and middle finger over the past couple of months that has been treated with Medrol, Keflex.  No known history of inflammatory arthritis.  On exam today she has mild increased erythema of the PIP joints of the palmar aspect of the right middle finger and index finger.  No tenderness of the joints.  Tenderness and swelling of the IP joints of both thumbs with full range of motion.  Reviewed patient's lab work that was performed in January which included CBC, CMP, thyroid panel, lipid panel.   No significant abnormalities noted.  Patient has not been assessed for autoimmune/inflammatory diseases.  She would like to have that done today.  Ordered ANA, CRP, ESR, RF.  Advised patient how to access results once they come through.  Explained to her that a nurse will contact her.  If there are any significant abnormalities she should follow back up with her PCP who may need to refer her to a specialist.  Will have patient take Medrol as it was helpful in the past for her symptoms I do have some suspicion that her symptoms could be related to inflammatory condition.  Reviewed that she can also take Tylenol and follow RICE guidelines.   Final Clinical Impressions(s) / UC Diagnoses   Final diagnoses:  Polyarthralgia  Erythema of joint     Discharge Instructions      -Sent Medrol to pharmacy.  You stated it helped you before.  I think will be helpful again. - Ice your joints and try to avoid any painful activities.  Tylenol as needed for pain. -I ordered multiple different labs to check for signs of inflammation, autoimmune disease.  Someone will contact you once the results are back.  If you have any significantly abnormal values, you should follow-up with PCP who may need to refer you to a specialist.     ED  Prescriptions     Medication Sig Dispense Auth. Provider   methylPREDNISolone (MEDROL DOSEPAK) 4 MG TBPK tablet Take according to dosepack instructions 21 tablet Danton Clap, PA-C      PDMP not reviewed this encounter.   Danton Clap, PA-C 12/11/22 1536

## 2022-12-11 NOTE — Discharge Instructions (Signed)
-  Sent Medrol to pharmacy.  You stated it helped you before.  I think will be helpful again. - Ice your joints and try to avoid any painful activities.  Tylenol as needed for pain. -I ordered multiple different labs to check for signs of inflammation, autoimmune disease.  Someone will contact you once the results are back.  If you have any significantly abnormal values, you should follow-up with PCP who may need to refer you to a specialist.

## 2022-12-12 LAB — C-REACTIVE PROTEIN: CRP: 0.6 mg/dL (ref <1.0)

## 2022-12-13 LAB — ANA W/REFLEX IF POSITIVE: Anti Nuclear Antibody (ANA): NEGATIVE

## 2022-12-13 LAB — RHEUMATOID FACTOR: Rheumatoid fact SerPl-aCnc: <10 IU/mL (ref <14.0)

## 2024-02-07 ENCOUNTER — Emergency Department (HOSPITAL_COMMUNITY): Admitting: Anesthesiology

## 2024-02-07 ENCOUNTER — Encounter (HOSPITAL_BASED_OUTPATIENT_CLINIC_OR_DEPARTMENT_OTHER): Payer: Self-pay | Admitting: Emergency Medicine

## 2024-02-07 ENCOUNTER — Emergency Department (HOSPITAL_BASED_OUTPATIENT_CLINIC_OR_DEPARTMENT_OTHER)

## 2024-02-07 ENCOUNTER — Encounter (HOSPITAL_COMMUNITY)
Admission: EM | Disposition: A | Discharge status: Home / Self Care | Payer: Self-pay | Source: Home / Self Care | Attending: Emergency Medicine

## 2024-02-07 ENCOUNTER — Emergency Department (HOSPITAL_COMMUNITY)

## 2024-02-07 ENCOUNTER — Ambulatory Visit (HOSPITAL_BASED_OUTPATIENT_CLINIC_OR_DEPARTMENT_OTHER)
Admission: EM | Admit: 2024-02-07 | Discharge: 2024-02-07 | Disposition: A | Discharge status: Home / Self Care | Attending: Emergency Medicine | Admitting: Emergency Medicine

## 2024-02-07 ENCOUNTER — Other Ambulatory Visit: Payer: Self-pay

## 2024-02-07 DIAGNOSIS — Z9049 Acquired absence of other specified parts of digestive tract: Secondary | ICD-10-CM | POA: Insufficient documentation

## 2024-02-07 DIAGNOSIS — I10 Essential (primary) hypertension: Secondary | ICD-10-CM | POA: Insufficient documentation

## 2024-02-07 DIAGNOSIS — E039 Hypothyroidism, unspecified: Secondary | ICD-10-CM | POA: Diagnosis not present

## 2024-02-07 DIAGNOSIS — R109 Unspecified abdominal pain: Secondary | ICD-10-CM

## 2024-02-07 DIAGNOSIS — Z6841 Body Mass Index (BMI) 40.0 and over, adult: Secondary | ICD-10-CM | POA: Diagnosis not present

## 2024-02-07 DIAGNOSIS — E6689 Other obesity not elsewhere classified: Secondary | ICD-10-CM | POA: Diagnosis not present

## 2024-02-07 DIAGNOSIS — N200 Calculus of kidney: Secondary | ICD-10-CM

## 2024-02-07 DIAGNOSIS — N201 Calculus of ureter: Secondary | ICD-10-CM | POA: Insufficient documentation

## 2024-02-07 DIAGNOSIS — K219 Gastro-esophageal reflux disease without esophagitis: Secondary | ICD-10-CM | POA: Insufficient documentation

## 2024-02-07 DIAGNOSIS — Z79899 Other long term (current) drug therapy: Secondary | ICD-10-CM | POA: Diagnosis not present

## 2024-02-07 HISTORY — DX: Sleep apnea, unspecified: G47.30

## 2024-02-07 LAB — COMPREHENSIVE METABOLIC PANEL WITH GFR
ALT: 19 U/L (ref 0–44)
AST: 14 U/L — ABNORMAL LOW (ref 15–41)
Albumin: 4.1 g/dL (ref 3.5–5.0)
Alkaline Phosphatase: 79 U/L (ref 38–126)
Anion gap: 13 (ref 5–15)
BUN: 11 mg/dL (ref 6–20)
CO2: 23 mmol/L (ref 22–32)
Calcium: 9.3 mg/dL (ref 8.9–10.3)
Chloride: 107 mmol/L (ref 98–111)
Creatinine, Ser: 0.94 mg/dL (ref 0.44–1.00)
GFR, Estimated: >60 mL/min (ref >60)
Glucose, Bld: 103 mg/dL — ABNORMAL HIGH (ref 70–99)
Potassium: 3.9 mmol/L (ref 3.5–5.1)
Sodium: 143 mmol/L (ref 135–145)
Total Bilirubin: 0.2 mg/dL (ref 0.0–1.2)
Total Protein: 6.8 g/dL (ref 6.5–8.1)

## 2024-02-07 LAB — URINALYSIS, MICROSCOPIC (REFLEX)

## 2024-02-07 LAB — URINALYSIS, ROUTINE W REFLEX MICROSCOPIC
Bilirubin Urine: NEGATIVE
Glucose, UA: NEGATIVE mg/dL
Ketones, ur: NEGATIVE mg/dL
Leukocytes,Ua: NEGATIVE
Nitrite: NEGATIVE
Protein, ur: 30 mg/dL — AB
Specific Gravity, Urine: >=1.03 (ref 1.005–1.030)
pH: 5 (ref 5.0–8.0)

## 2024-02-07 LAB — CBC WITH DIFFERENTIAL/PLATELET
Abs Immature Granulocytes: 0.03 10*3/uL (ref 0.00–0.07)
Basophils Absolute: 0.1 10*3/uL (ref 0.0–0.1)
Basophils Relative: 1 %
Eosinophils Absolute: 0.1 10*3/uL (ref 0.0–0.5)
Eosinophils Relative: 1 %
HCT: 45 % (ref 36.0–46.0)
Hemoglobin: 15.5 g/dL — ABNORMAL HIGH (ref 12.0–15.0)
Immature Granulocytes: 0 %
Lymphocytes Relative: 22 %
Lymphs Abs: 2.1 10*3/uL (ref 0.7–4.0)
MCH: 31.2 pg (ref 26.0–34.0)
MCHC: 34.4 g/dL (ref 30.0–36.0)
MCV: 90.5 fL (ref 80.0–100.0)
Monocytes Absolute: 0.8 10*3/uL (ref 0.1–1.0)
Monocytes Relative: 8 %
Neutro Abs: 6.8 10*3/uL (ref 1.7–7.7)
Neutrophils Relative %: 68 %
Platelets: 362 10*3/uL (ref 150–400)
RBC: 4.97 MIL/uL (ref 3.87–5.11)
RDW: 12.2 % (ref 11.5–15.5)
WBC: 9.9 10*3/uL (ref 4.0–10.5)
nRBC: 0 % (ref 0.0–0.2)

## 2024-02-07 LAB — LIPASE, BLOOD: Lipase: 63 U/L — ABNORMAL HIGH (ref 11–51)

## 2024-02-07 LAB — HCG, SERUM, QUALITATIVE: Preg, Serum: NEGATIVE

## 2024-02-07 SURGERY — CYSTOURETEROSCOPY, WITH RETROGRADE PYELOGRAM AND STENT INSERTION
Anesthesia: General | Laterality: Right

## 2024-02-07 MED ORDER — AMISULPRIDE (ANTIEMETIC) 5 MG/2ML IV SOLN
10.0000 mg | Freq: Once | INTRAVENOUS | Status: DC | PRN
Start: 2024-02-07 — End: 2024-02-07

## 2024-02-07 MED ORDER — CEFAZOLIN SODIUM-DEXTROSE 2-4 GM/100ML-% IV SOLN
INTRAVENOUS | Status: AC
  Filled 2024-02-07: qty 100

## 2024-02-07 MED ORDER — PROPOFOL 10 MG/ML IV BOLUS
INTRAVENOUS | Status: AC
  Filled 2024-02-07: qty 20

## 2024-02-07 MED ORDER — FENTANYL CITRATE (PF) 250 MCG/5ML IJ SOLN
INTRAMUSCULAR | Status: DC | PRN
Start: 2024-02-07 — End: 2024-02-07
  Administered 2024-02-07 (×2): 50 ug via INTRAVENOUS

## 2024-02-07 MED ORDER — OXYCODONE HCL 5 MG PO TABS: 5.0000 mg | ORAL_TABLET | ORAL | 0 refills | Status: AC | PRN

## 2024-02-07 MED ORDER — LIDOCAINE HCL URETHRAL/MUCOSAL 2 % EX GEL
1.0000 | Freq: Once | CUTANEOUS | Status: AC
  Administered 2024-02-07: 1 via URETHRAL
  Filled 2024-02-07: qty 11

## 2024-02-07 MED ORDER — DEXAMETHASONE SODIUM PHOSPHATE 10 MG/ML IJ SOLN
INTRAMUSCULAR | Status: DC | PRN
Start: 2024-02-07 — End: 2024-02-07
  Administered 2024-02-07: 10 mg via INTRAVENOUS

## 2024-02-07 MED ORDER — DEXTROSE 5 % IV SOLN
INTRAVENOUS | Status: DC | PRN
  Administered 2024-02-07: 3 g via INTRAVENOUS

## 2024-02-07 MED ORDER — HYDROMORPHONE HCL 1 MG/ML IJ SOLN
0.5000 mg | Freq: Once | INTRAMUSCULAR | Status: AC
  Administered 2024-02-07: 0.5 mg via INTRAVENOUS
  Filled 2024-02-07: qty 1

## 2024-02-07 MED ORDER — HYDROMORPHONE HCL 1 MG/ML IJ SOLN
1.0000 mg | Freq: Once | INTRAMUSCULAR | Status: AC
  Administered 2024-02-07: 1 mg via INTRAVENOUS
  Filled 2024-02-07: qty 1

## 2024-02-07 MED ORDER — ACETAMINOPHEN 10 MG/ML IV SOLN
INTRAVENOUS | Status: AC
  Filled 2024-02-07: qty 100

## 2024-02-07 MED ORDER — LIDOCAINE 2% (20 MG/ML) 5 ML SYRINGE: INTRAMUSCULAR | Status: DC | PRN

## 2024-02-07 MED ORDER — FENTANYL CITRATE (PF) 100 MCG/2ML IJ SOLN
INTRAMUSCULAR | Status: AC
  Filled 2024-02-07: qty 2

## 2024-02-07 MED ORDER — ONDANSETRON HCL 4 MG/2ML IJ SOLN
INTRAMUSCULAR | Status: DC | PRN
Start: 2024-02-07 — End: 2024-02-07
  Administered 2024-02-07: 4 mg via INTRAVENOUS

## 2024-02-07 MED ORDER — ONDANSETRON HCL 4 MG/2ML IJ SOLN
4.0000 mg | Freq: Once | INTRAMUSCULAR | Status: AC
  Administered 2024-02-07: 4 mg via INTRAVENOUS
  Filled 2024-02-07: qty 2

## 2024-02-07 MED ORDER — LACTATED RINGERS IV SOLN: INTRAVENOUS | Status: DC

## 2024-02-07 MED ORDER — LIDOCAINE HCL (CARDIAC) PF 100 MG/5ML IV SOSY
PREFILLED_SYRINGE | INTRAVENOUS | Status: DC | PRN
  Administered 2024-02-07: 100 mg via INTRATRACHEAL

## 2024-02-07 MED ORDER — STERILE WATER FOR IRRIGATION IR SOLN
Status: DC | PRN
  Administered 2024-02-07: 1000 mL

## 2024-02-07 MED ORDER — SODIUM CHLORIDE 0.9 % IV BOLUS
1000.0000 mL | Freq: Once | INTRAVENOUS | Status: AC
  Administered 2024-02-07: 1000 mL via INTRAVENOUS

## 2024-02-07 MED ORDER — ACETAMINOPHEN 10 MG/ML IV SOLN: INTRAVENOUS | Status: DC | PRN

## 2024-02-07 MED ORDER — OXYCODONE HCL 5 MG PO TABS: 5.0000 mg | ORAL_TABLET | Freq: Once | ORAL | Status: DC | PRN

## 2024-02-07 MED ORDER — PROPOFOL 10 MG/ML IV BOLUS
INTRAVENOUS | Status: DC | PRN
Start: 2024-02-07 — End: 2024-02-07
  Administered 2024-02-07: 200 mg via INTRAVENOUS

## 2024-02-07 MED ORDER — FENTANYL CITRATE PF 50 MCG/ML IJ SOSY: 25.0000 ug | PREFILLED_SYRINGE | INTRAMUSCULAR | Status: DC | PRN

## 2024-02-07 MED ORDER — DEXMEDETOMIDINE HCL IN NACL 80 MCG/20ML IV SOLN
INTRAVENOUS | Status: DC | PRN
  Administered 2024-02-07: 12 ug via INTRAVENOUS

## 2024-02-07 MED ORDER — MIDAZOLAM HCL 2 MG/2ML IJ SOLN
INTRAMUSCULAR | Status: AC
  Filled 2024-02-07: qty 2

## 2024-02-07 MED ORDER — CHLORHEXIDINE GLUCONATE 0.12 % MT SOLN
15.0000 mL | Freq: Once | OROMUCOSAL | Status: AC
  Administered 2024-02-07: 15 mL via OROMUCOSAL

## 2024-02-07 MED ORDER — MIDAZOLAM HCL 2 MG/2ML IJ SOLN
INTRAMUSCULAR | Status: DC | PRN
  Administered 2024-02-07: 2 mg via INTRAVENOUS

## 2024-02-07 MED ORDER — STERILE WATER FOR IRRIGATION IR SOLN
Status: DC | PRN
  Administered 2024-02-07: 3000 mL

## 2024-02-07 MED ORDER — OXYCODONE HCL 5 MG/5ML PO SOLN: 5.0000 mg | Freq: Once | ORAL | Status: DC | PRN

## 2024-02-07 MED ORDER — ACETAMINOPHEN 10 MG/ML IV SOLN: 1000.0000 mg | Freq: Once | INTRAVENOUS | Status: DC | PRN

## 2024-02-07 MED ORDER — IOHEXOL 300 MG/ML  SOLN
INTRAMUSCULAR | Status: DC | PRN
  Administered 2024-02-07: 10 mL

## 2024-02-07 SURGICAL SUPPLY — 18 items
BAG COUNTER SPONGE SURGICOUNT (BAG) IMPLANT
BAG URO CATCHER STRL LF (MISCELLANEOUS) ×1 IMPLANT
BASKET ZERO TIP NITINOL 2.4FR (BASKET) IMPLANT
CATH URETL OPEN END 6FR 70 (CATHETERS) IMPLANT
CLOTH BEACON ORANGE TIMEOUT ST (SAFETY) ×1 IMPLANT
GLOVE SURG LX STRL 7.5 STRW (GLOVE) ×1 IMPLANT
GOWN STRL REUS W/ TWL XL LVL3 (GOWN DISPOSABLE) ×1 IMPLANT
GUIDEWIRE STR DUAL SENSOR (WIRE) ×1 IMPLANT
GUIDEWIRE ZIPWRE .038 STRAIGHT (WIRE) IMPLANT
IV NS 1000ML BAXH (IV SOLUTION) ×1 IMPLANT
KIT TURNOVER KIT A (KITS) IMPLANT
MANIFOLD NEPTUNE II (INSTRUMENTS) ×1 IMPLANT
PACK CYSTO (CUSTOM PROCEDURE TRAY) ×1 IMPLANT
SHEATH NAVIGATOR HD 12/14X36 (SHEATH) IMPLANT
STENT URET 6FRX26 CONTOUR (STENTS) IMPLANT
TRACTIP FLEXIVA PULS ID 200XHI (Laser) IMPLANT
TUBING CONNECTING 10 (TUBING) ×1 IMPLANT
TUBING UROLOGY SET (TUBING) ×1 IMPLANT

## 2024-02-07 NOTE — H&P (View-Only) (Signed)
 Urology Consult Note   Requesting Attending Physician:  Mozell Arias, MD Service Providing Consult: Urology  Consulting Attending: Dr. Rozanne Corners   Reason for Consult: Obstructing right ureteral stone, intractable pain  HPI: Cynthia Green is seen in consultation for reasons noted above at the request of Mozell Arias, MD  ------------------  Assessment:  43 y.o. female with 6 x 15 mm right UPJ stone   Recommendations: # Right UPJ stone Patient's pain is well-managed but she continues to require IV pain medicine. Case and plan discussed, laser lithotripsy/stent versus ESWL.  On review of the imaging it was felt that her habitus, the size of the stone, and density, lowered the probability of successful fracturing.  The shared decision was made to proceed with ureteral stent placement in the OR and definitive stone management on an outpatient basis.  Reviewed with patient and her sister. Remain n.p.o. To the OR for cystoscopy, right retrograde pyelogram, and right ureteral stent placement with Dr. Antonia Kite around 4:30  Case and plan discussed with Dr. Rozanne Corners  Past Medical History: Past Medical History:  Diagnosis Date   Chronic sciatica of left side 10/27/2015   Fatigue    GERD (gastroesophageal reflux disease)    Hypertension    Hypothyroid    Migraine    Mood disorder (HCC)    Morbid obesity (HCC)    Vitamin D deficiency     Past Surgical History:  Past Surgical History:  Procedure Laterality Date   CHOLECYSTECTOMY     SLEEVE GASTROPLASTY      Medication: No current facility-administered medications for this encounter.   Current Outpatient Medications  Medication Sig Dispense Refill   amphetamine-dextroamphetamine (ADDERALL XR) 30 MG 24 hr capsule Take 30 mg by mouth daily.     Armodafinil 200 MG TABS Take by mouth.     baclofen (LIORESAL) 10 MG tablet Take 10 mg by mouth 3 (three) times daily.     carbamazepine (EQUETRO) 200 MG CP12 12 hr capsule  Take 200 mg by mouth.     clotrimazole -betamethasone  (LOTRISONE ) cream Apply to affected area 2 times daily prn 45 g 0   FLUoxetine (PROZAC) 20 MG capsule Take 20 mg by mouth daily.     hydrochlorothiazide (HYDRODIURIL) 25 MG tablet Take 25 mg by mouth daily.     lamoTRIgine (LAMICTAL) 200 MG tablet Take 200 mg by mouth daily.     levothyroxine (SYNTHROID, LEVOTHROID) 88 MCG tablet Take 88 mcg by mouth daily before breakfast.     methylPREDNISolone  (MEDROL  DOSEPAK) 4 MG TBPK tablet Take according to dosepack instructions 21 tablet 0   predniSONE  (DELTASONE ) 10 MG tablet Take 6 tablets  today, on day 2 take 5 tablets, day 3 take 4 tablets, day 4 take 3 tablets, day 5 take  2 tablets and 1 tablet the last day 21 tablet 0   topiramate (TOPAMAX) 50 MG tablet Take 50 mg by mouth.     traZODone (DESYREL) 50 MG tablet Take 50 mg by mouth at bedtime.     vitamin B-12 (CYANOCOBALAMIN) 1000 MCG tablet Take 1,000 mcg by mouth daily.     VITAMIN D PO Take 2,000 Units by mouth daily.     zonisamide (ZONEGRAN) 25 MG capsule Take 25 mg by mouth daily.      Allergies: Allergies  Allergen Reactions   Morphine And Codeine Rash   Azithromycin Rash    Social History: Social History   Tobacco Use   Smoking status: Never   Smokeless tobacco:  Never  Vaping Use   Vaping status: Never Used  Substance Use Topics   Alcohol use: Not Currently   Drug use: Never    Family History Family History  Problem Relation Age of Onset   Hyperlipidemia Mother    Thyroid disease Mother    Cirrhosis Mother     Review of Systems  Genitourinary:  Negative for dysuria, flank pain, frequency, hematuria and urgency.     Objective   Vital signs in last 24 hours: BP (!) 142/110 (BP Location: Right Arm)   Pulse 77   Temp 97.9 F (36.6 C) (Oral)   Resp 16   Ht 5\' 5"  (1.651 m)   Wt (!) 145.2 kg   LMP 01/29/2024 (Exact Date)   SpO2 99%   BMI 53.25 kg/m   Physical Exam General: A&O, resting,  appropriate HEENT: [place]/AT Pulmonary: Normal work of breathing Cardiovascular: no cyanosis Abdomen: Soft, NTTP, non-distended Neuro: Appropriate, no focal neurological deficits  Most Recent Labs: Lab Results  Component Value Date   WBC 9.9 02/07/2024   HGB 15.5 (H) 02/07/2024   HCT 45.0 02/07/2024   PLT 362 02/07/2024    Lab Results  Component Value Date   NA 143 02/07/2024   K 3.9 02/07/2024   CL 107 02/07/2024   CO2 23 02/07/2024   BUN 11 02/07/2024   CREATININE 0.94 02/07/2024   CALCIUM 9.3 02/07/2024    No results found for: "INR", "APTT"   Urine Culture: @[id number] (laburin,org,[id number])@   IMAGING: CT Renal Stone Study Result Date: 02/07/2024 CLINICAL DATA:  43 year old female with right side back and flank pain times 3 days. EXAM: CT ABDOMEN AND PELVIS WITHOUT CONTRAST TECHNIQUE: Multidetector CT imaging of the abdomen and pelvis was performed following the standard protocol without IV contrast. RADIATION DOSE REDUCTION: This exam was performed according to the departmental dose-optimization program which includes automated exposure control, adjustment of the mA and/or kV according to patient size and/or use of iterative reconstruction technique. COMPARISON:  Lumbar MRI 06/12/2015. FINDINGS: Lower chest: Normal.  Lung bases are clear. Hepatobiliary: Cholecystectomy, with a small rim calcified gallstone remaining within the residual bile duct at the porta hepatis on coronal image 67. Otherwise negative noncontrast liver. Pancreas: Negative. Spleen: Negative. Adrenals/Urinary Tract: Normal adrenal glands. Left nephrolithiasis, mostly punctate left renal calculi. No left hydronephrosis. Decompressed left ureter. Right nephromegaly and hydronephrosis with pararenal fluid or edema. Intrarenal calculi, but a large and oblong 6 x 15 mm calculus is lodged at the right ureteropelvic junction on series 2, image 41. Regional right periureteral inflammatory stranding, but the right  ureter is decompressed distal to the stone. The bladder is fully decompressed. There are multiple pelvic phleboliths more numerous on the left. Stomach/Bowel: Negative large bowel aside from mild retained stool. Normal appendix arising from the cecum on series 2, image 62. Nondilated small bowel. Broad-based fat containing midline umbilical hernia, hernia sac about 8 cm in length (sagittal image 122). No herniated bowel. No active inflammation. Chronic postoperative changes to the greater curve of the stomach, such as gastric sleeve. No pneumoperitoneum. No free fluid. Vascular/Lymphatic: Normal caliber abdominal aorta. No calcified atherosclerosis or lymphadenopathy identified. Reproductive: Negative noncontrast appearance. Other: No pelvis free fluid. Musculoskeletal: Lumbosacral junction advanced disc degeneration with vacuum disc, endplate spurring. Lower lumbar facet degeneration. No acute osseous abnormality identified. IMPRESSION: 1. Acute Obstructive Uropathy on the right due to a large and oblong 15 mm stone lodged at the Right UPJ. Additional right nephrolithiasis. Possible mild forniceal rupture. 2. Left nephrolithiasis  without obstruction. 3. Broad-based 8 cm fat containing midline umbilical hernia with no complicating features. Normal appendix. 4. Cholecystectomy with a small rim calcified gallstone within the residual bile duct at the porta hepatis (coronal image 67). Electronically Signed   By: Marlise Simpers M.D.   On: 02/07/2024 07:59    ------  Alla Ar, NP Pager: [phone]   Please contact the urology consult pager with any further questions/concerns.

## 2024-02-07 NOTE — ED Notes (Signed)
 IV secured, pt informed to head straight to [facility] ED, no stops along the way, remain NPO, call 911 for any emergencies. Pt and family expressed understanding of information.

## 2024-02-07 NOTE — ED Triage Notes (Signed)
 Pt states right side back/flank pain, radiates to front at times. Since Sunday. Comes and goes but is worse today.

## 2024-02-07 NOTE — ED Provider Notes (Signed)
  Physical Exam  BP (!) 171/97 (BP Location: Right Wrist)   Pulse 85   Temp 98.4 F (36.9 C) (Oral)   Resp 20   Ht 5\' 5"  (1.651 m)   Wt (!) 145.2 kg   LMP 01/29/2024 (Exact Date)   SpO2 100%   BMI 53.25 kg/m   Physical Exam  Procedures  Procedures  ED Course / MDM     Received care of patient from Dr. Candelaria Chaco. Please see his note for prior hx physical and care.  Right flank pain for 3 days, waxing and waning.  Hx cholecystectomy.  Labs, imaging pending.    Labs obtained and personally evaluated interpreted by me show negative pregnancy test, no transaminitis, mild elevation in lipase but not consistent with pancreatitis, no leukocytosis.  Urinalysis shows negative nitrites, negative leukocytes, with 11-20 white blood cells on microscopic and 11-20 squamous cells.    CT stone study shows acute obstructive uropathy on the right due to large and oblong 15 mm stone lodged at the right UPJ with possible mild forniceal rupture.  She is also noted to have an 8 cm fat-containing midline umbilical hernia with no complicating features, cholecystectomy with a small rim calcified gallstone within the residual bile duct at the porta hepatis//no biliary obstruction.   Discussed with Dr. Sandford Croon. Her pain is returning and difficult to control and given size of stone agree with transfer for further evaluation. Ordered in and out catheterization for new UA/urine culture.   Will transfer to [facility] via [facility]> [facility] accepting. Dr. Sandford Croon will need to be called on patient's arrival to ED.       Scarlette Currier, MD 02/07/24 [phone]

## 2024-02-07 NOTE — Discharge Instructions (Addendum)

## 2024-02-07 NOTE — ED Notes (Signed)
 Pt A&O x 4, steady gait noted

## 2024-02-07 NOTE — Anesthesia Procedure Notes (Signed)
 Procedure Name: LMA Insertion Date/Time: 02/07/2024 5:02 PM  Performed by: Virgil Griffiths, CRNAPre-anesthesia Checklist: Patient identified, Emergency Drugs available, Suction available and Patient being monitored Patient Re-evaluated:Patient Re-evaluated prior to induction Oxygen Delivery Method: Circle System Utilized Preoxygenation: Pre-oxygenation with 100% oxygen Induction Type: IV induction Ventilation: Mask ventilation without difficulty LMA: LMA inserted LMA Size: 4.0 Number of attempts: 1 Airway Equipment and Method: Bite block Placement Confirmation: positive ETCO2 Tube secured with: Tape Dental Injury: Teeth and Oropharynx as per pre-operative assessment

## 2024-02-07 NOTE — Transfer of Care (Signed)
 Immediate Anesthesia Transfer of Care Note  Patient: Cynthia Green  Procedure(s) Performed: Alfreida Inches, WITH RETROGRADE PYELOGRAM AND STENT INSERTION (Right)  Patient Location: PACU  Anesthesia Type:General  Level of Consciousness: drowsy and patient cooperative  Airway & Oxygen Therapy: Patient Spontanous Breathing  Post-op Assessment: Report given to RN and Post -op Vital signs reviewed and stable  Post vital signs: Reviewed and stable  Last Vitals:  Vitals Value Taken Time  BP 137/83 02/07/24 1723  Temp    Pulse 103 02/07/24 1725  Resp 14 02/07/24 1725  SpO2 96 % 02/07/24 1725  Vitals shown include unfiled device data.  Last Pain:  Vitals:   02/07/24 1552  TempSrc:   PainSc: 3          Complications: No notable events documented.

## 2024-02-07 NOTE — ED Notes (Signed)
 RN gave pt CHG wipes and gown per OR instructions

## 2024-02-07 NOTE — ED Provider Notes (Signed)
 Patient care assumed after transfer from [facility] for urology evaluation given Ct imaging that shows   1. Acute Obstructive Uropathy on the right due to a large and oblong 15 mm stone lodged at the Right UPJ. Additional right nephrolithiasis. Possible mild forniceal rupture. 2. Left nephrolithiasis without obstruction. 3. Broad-based 8 cm fat containing midline umbilical hernia with no complicating features. Normal appendix. 4. Cholecystectomy with a small rim calcified gallstone within the residual bile duct at the porta hepatis  Discussed patient with urology NP Alla Ar who will see the patient.   Urology has seen and evaluated the patient, plan for KUB, if stone is visible with this imaging, likely d/c with plans for stone truck on Friday. If stone is not visible on this imaging, may need admission for intra-operative management.   2:00PM: KUB has resulted and reveals  1. An 11 mm rounded density projecting over the right paramidline abdomen at the L2 level likely corresponds to a known stone at the right UPJ, as described on the earlier same-day CT abdomen/pelvis. 2. Known left-sided renal calculi are not well visualized on this exam. 3. Nonobstructive bowel gas pattern.  I have personally reviewed and interpreted this imaging and agree with radiology interpretation.   Discussed with urology, they would like to take patient to the OR at 4 pm today for surgical management. Patient understanding and in agreement with this plan.   Coulson Wehner A, PA-C 02/07/24 1401    Mozell Arias, MD 02/07/24 1504

## 2024-02-07 NOTE — Anesthesia Postprocedure Evaluation (Signed)
 Anesthesia Post Note  Patient: Cynthia Green  Procedure(s) Performed: CYSTOURETEROSCOPY, WITH RETROGRADE PYELOGRAM AND STENT INSERTION (Right)     Patient location during evaluation: PACU Anesthesia Type: General Level of consciousness: awake and alert Pain management: pain level controlled Vital Signs Assessment: post-procedure vital signs reviewed and stable Respiratory status: spontaneous breathing, nonlabored ventilation and respiratory function stable Cardiovascular status: blood pressure returned to baseline and stable Postop Assessment: no apparent nausea or vomiting Anesthetic complications: no   No notable events documented.  Last Vitals:  Vitals:   02/07/24 1745 02/07/24 1807  BP: (!) 142/82 (!) 140/98  Pulse: 94 91  Resp: 20   Temp:    SpO2: 97% 96%    Last Pain:  Vitals:   02/07/24 1745  TempSrc:   PainSc: 0-No pain                 Earvin Goldberg

## 2024-02-07 NOTE — Consult Note (Signed)
 Urology Consult Note   Requesting Attending Physician:  Mozell Arias, MD Service Providing Consult: Urology  Consulting Attending: Dr. Rozanne Corners   Reason for Consult: Obstructing right ureteral stone, intractable pain  HPI: Cynthia Green is seen in consultation for reasons noted above at the request of Mozell Arias, MD  ------------------  Assessment:  43 y.o. female with 6 x 15 mm right UPJ stone   Recommendations: # Right UPJ stone Patient's pain is well-managed but she continues to require IV pain medicine. Case and plan discussed, laser lithotripsy/stent versus ESWL.  On review of the imaging it was felt that her habitus, the size of the stone, and density, lowered the probability of successful fracturing.  The shared decision was made to proceed with ureteral stent placement in the OR and definitive stone management on an outpatient basis.  Reviewed with patient and her sister. Remain n.p.o. To the OR for cystoscopy, right retrograde pyelogram, and right ureteral stent placement with Dr. Antonia Kite around 4:30  Case and plan discussed with Dr. Rozanne Corners  Past Medical History: Past Medical History:  Diagnosis Date   Chronic sciatica of left side 10/27/2015   Fatigue    GERD (gastroesophageal reflux disease)    Hypertension    Hypothyroid    Migraine    Mood disorder (HCC)    Morbid obesity (HCC)    Vitamin D deficiency     Past Surgical History:  Past Surgical History:  Procedure Laterality Date   CHOLECYSTECTOMY     SLEEVE GASTROPLASTY      Medication: No current facility-administered medications for this encounter.   Current Outpatient Medications  Medication Sig Dispense Refill   amphetamine-dextroamphetamine (ADDERALL XR) 30 MG 24 hr capsule Take 30 mg by mouth daily.     Armodafinil 200 MG TABS Take by mouth.     baclofen (LIORESAL) 10 MG tablet Take 10 mg by mouth 3 (three) times daily.     carbamazepine (EQUETRO) 200 MG CP12 12 hr capsule  Take 200 mg by mouth.     clotrimazole -betamethasone  (LOTRISONE ) cream Apply to affected area 2 times daily prn 45 g 0   FLUoxetine (PROZAC) 20 MG capsule Take 20 mg by mouth daily.     hydrochlorothiazide (HYDRODIURIL) 25 MG tablet Take 25 mg by mouth daily.     lamoTRIgine (LAMICTAL) 200 MG tablet Take 200 mg by mouth daily.     levothyroxine (SYNTHROID, LEVOTHROID) 88 MCG tablet Take 88 mcg by mouth daily before breakfast.     methylPREDNISolone  (MEDROL  DOSEPAK) 4 MG TBPK tablet Take according to dosepack instructions 21 tablet 0   predniSONE  (DELTASONE ) 10 MG tablet Take 6 tablets  today, on day 2 take 5 tablets, day 3 take 4 tablets, day 4 take 3 tablets, day 5 take  2 tablets and 1 tablet the last day 21 tablet 0   topiramate (TOPAMAX) 50 MG tablet Take 50 mg by mouth.     traZODone (DESYREL) 50 MG tablet Take 50 mg by mouth at bedtime.     vitamin B-12 (CYANOCOBALAMIN) 1000 MCG tablet Take 1,000 mcg by mouth daily.     VITAMIN D PO Take 2,000 Units by mouth daily.     zonisamide (ZONEGRAN) 25 MG capsule Take 25 mg by mouth daily.      Allergies: Allergies  Allergen Reactions   Morphine And Codeine Rash   Azithromycin Rash    Social History: Social History   Tobacco Use   Smoking status: Never   Smokeless tobacco:  Never  Vaping Use   Vaping status: Never Used  Substance Use Topics   Alcohol use: Not Currently   Drug use: Never    Family History Family History  Problem Relation Age of Onset   Hyperlipidemia Mother    Thyroid disease Mother    Cirrhosis Mother     Review of Systems  Genitourinary:  Negative for dysuria, flank pain, frequency, hematuria and urgency.     Objective   Vital signs in last 24 hours: BP (!) 142/110 (BP Location: Right Arm)   Pulse 77   Temp 97.9 F (36.6 C) (Oral)   Resp 16   Ht 5\' 5"  (1.651 m)   Wt (!) 145.2 kg   LMP 01/29/2024 (Exact Date)   SpO2 99%   BMI 53.25 kg/m   Physical Exam General: A&O, resting,  appropriate HEENT: [place]/AT Pulmonary: Normal work of breathing Cardiovascular: no cyanosis Abdomen: Soft, NTTP, non-distended Neuro: Appropriate, no focal neurological deficits  Most Recent Labs: Lab Results  Component Value Date   WBC 9.9 02/07/2024   HGB 15.5 (H) 02/07/2024   HCT 45.0 02/07/2024   PLT 362 02/07/2024    Lab Results  Component Value Date   NA 143 02/07/2024   K 3.9 02/07/2024   CL 107 02/07/2024   CO2 23 02/07/2024   BUN 11 02/07/2024   CREATININE 0.94 02/07/2024   CALCIUM 9.3 02/07/2024    No results found for: "INR", "APTT"   Urine Culture: @[id number] (laburin,org,[id number],[id number])@   IMAGING: CT Renal Stone Study Result Date: 02/07/2024 CLINICAL DATA:  43 year old female with right side back and flank pain times 3 days. EXAM: CT ABDOMEN AND PELVIS WITHOUT CONTRAST TECHNIQUE: Multidetector CT imaging of the abdomen and pelvis was performed following the standard protocol without IV contrast. RADIATION DOSE REDUCTION: This exam was performed according to the departmental dose-optimization program which includes automated exposure control, adjustment of the mA and/or kV according to patient size and/or use of iterative reconstruction technique. COMPARISON:  Lumbar MRI 06/12/2015. FINDINGS: Lower chest: Normal.  Lung bases are clear. Hepatobiliary: Cholecystectomy, with a small rim calcified gallstone remaining within the residual bile duct at the porta hepatis on coronal image 67. Otherwise negative noncontrast liver. Pancreas: Negative. Spleen: Negative. Adrenals/Urinary Tract: Normal adrenal glands. Left nephrolithiasis, mostly punctate left renal calculi. No left hydronephrosis. Decompressed left ureter. Right nephromegaly and hydronephrosis with pararenal fluid or edema. Intrarenal calculi, but a large and oblong 6 x 15 mm calculus is lodged at the right ureteropelvic junction on series 2, image 41. Regional right periureteral inflammatory stranding, but the right  ureter is decompressed distal to the stone. The bladder is fully decompressed. There are multiple pelvic phleboliths more numerous on the left. Stomach/Bowel: Negative large bowel aside from mild retained stool. Normal appendix arising from the cecum on series 2, image 62. Nondilated small bowel. Broad-based fat containing midline umbilical hernia, hernia sac about 8 cm in length (sagittal image 122). No herniated bowel. No active inflammation. Chronic postoperative changes to the greater curve of the stomach, such as gastric sleeve. No pneumoperitoneum. No free fluid. Vascular/Lymphatic: Normal caliber abdominal aorta. No calcified atherosclerosis or lymphadenopathy identified. Reproductive: Negative noncontrast appearance. Other: No pelvis free fluid. Musculoskeletal: Lumbosacral junction advanced disc degeneration with vacuum disc, endplate spurring. Lower lumbar facet degeneration. No acute osseous abnormality identified. IMPRESSION: 1. Acute Obstructive Uropathy on the right due to a large and oblong 15 mm stone lodged at the Right UPJ. Additional right nephrolithiasis. Possible mild forniceal rupture. 2. Left nephrolithiasis  without obstruction. 3. Broad-based 8 cm fat containing midline umbilical hernia with no complicating features. Normal appendix. 4. Cholecystectomy with a small rim calcified gallstone within the residual bile duct at the porta hepatis (coronal image 67). Electronically Signed   By: Marlise Simpers M.D.   On: 02/07/2024 07:59    ------  Alla Ar, NP Pager: [phone]   Please contact the urology consult pager with any further questions/concerns.

## 2024-02-07 NOTE — Op Note (Signed)
 Preoperative diagnosis:  Right ureteral stone   Postoperative diagnosis:  Right ureteral stone   Procedure:  Cystoscopy Right ureteral stent placement (6 x 26 - no string) Right retrograde pyelography with interpretation   Surgeon: Izetta Marshall. M.D.  Anesthesia: General  Complications: None  Intraoperative findings: Right retrograde pyelography was performed with a 6 French ureteral catheter and Omnipaque contrast.  This revealed a large filling defect in the proximal right ureter consistent with the patient's known calculus.  EBL: Minimal  Specimens: None  Indication: Cynthia Green is a 43 y.o. patient with a large obstructing 15 mm right UPJ stone and small right renal calculi with uncontrolled pain. After reviewing the management options for treatment, he elected to proceed with the above surgical procedure(s). We have discussed the potential benefits and risks of the procedure, side effects of the proposed treatment, the likelihood of the patient achieving the goals of the procedure, and any potential problems that might occur during the procedure or recuperation. Informed consent has been obtained.  Description of procedure:  The patient was taken to the operating room and general anesthesia was induced.  The patient was placed in the dorsal lithotomy position, prepped and draped in the usual sterile fashion, and preoperative antibiotics were administered. A preoperative time-out was performed.   Cystourethroscopy was performed.  The patient's urethra was examined and was normal. The bladder was then systematically examined in its entirety. There was no evidence for any bladder tumors, stones, or other mucosal pathology.    Attention then turned to the right ureteral orifice and a ureteral catheter was used to intubate the ureteral orifice.  Omnipaque contrast was injected through the ureteral catheter and a retrograde pyelogram was performed with findings as  dictated above.  A 0.38 sensor guidewire was then advanced up the right ureter into the renal pelvis under fluoroscopic guidance.  The wire was then backloaded through the cystoscope and a ureteral stent was advance over the wire using Seldinger technique.  The stent was positioned appropriately under fluoroscopic and cystoscopic guidance.  The wire was then removed with an adequate stent curl noted in the renal pelvis as well as in the bladder.  The bladder was then emptied and the procedure ended.  The patient appeared to tolerate the procedure well and without complications.  The patient was able to be awakened and transferred to the recovery unit in satisfactory condition.    Izetta Marshall MD

## 2024-02-07 NOTE — ED Provider Notes (Signed)
 [place] [facility] Provider Note   CSN: [id number] Arrival date & time: 02/07/24  4098     History  Chief Complaint  Patient presents with   Flank Pain    Cynthia Green is a 43 y.o. female.  The history is provided by the patient.  Flank Pain  She has history of hypertension and comes in because of right flank pain for the last 3 days.  Pain has waxed and waned but it woke her up this morning.  There is no associated nausea or vomiting.  Pain sometimes radiates to the right lateral abdomen but not to the right lower abdomen.  She denies any urinary difficulty and denies any fever or chills.  She tried taking tizanidine which seemed to give some relief but pain recurred within 30 minutes.  She denies any unusual activity or trauma.  She does have history of back pain.  She is status post cholecystectomy.   Home Medications Prior to Admission medications   Medication Sig Start Date End Date Taking? Authorizing Provider  amphetamine-dextroamphetamine (ADDERALL XR) 30 MG 24 hr capsule Take 30 mg by mouth daily.    [provider]  Armodafinil 200 MG TABS Take by mouth.    [provider]  baclofen (LIORESAL) 10 MG tablet Take 10 mg by mouth 3 (three) times daily.    [provider]  carbamazepine (EQUETRO) 200 MG CP12 12 hr capsule Take 200 mg by mouth.    [provider]  clotrimazole -betamethasone  (LOTRISONE ) cream Apply to affected area 2 times daily prn 11/14/22   Mayer, Jodi R, NP  FLUoxetine (PROZAC) 20 MG capsule Take 20 mg by mouth daily.    [provider]  hydrochlorothiazide (HYDRODIURIL) 25 MG tablet Take 25 mg by mouth daily.    [provider]  lamoTRIgine (LAMICTAL) 200 MG tablet Take 200 mg by mouth daily.    [provider]  levothyroxine (SYNTHROID, LEVOTHROID) 88 MCG tablet Take 88 mcg by mouth daily before breakfast.    [provider]  methylPREDNISolone   (MEDROL  DOSEPAK) 4 MG TBPK tablet Take according to dosepack instructions 12/11/22   Floydene Hy, PA-C  predniSONE  (DELTASONE ) 10 MG tablet Take 6 tablets  today, on day 2 take 5 tablets, day 3 take 4 tablets, day 4 take 3 tablets, day 5 take  2 tablets and 1 tablet the last day 10/30/19   Stafford Eagles, PA-C  topiramate (TOPAMAX) 50 MG tablet Take 50 mg by mouth.    [provider]  traZODone (DESYREL) 50 MG tablet Take 50 mg by mouth at bedtime.    [provider]  vitamin B-12 (CYANOCOBALAMIN) 1000 MCG tablet Take 1,000 mcg by mouth daily.    [provider]  VITAMIN D PO Take 2,000 Units by mouth daily.    [provider]  zonisamide (ZONEGRAN) 25 MG capsule Take 25 mg by mouth daily.    [provider]      Allergies    Morphine and codeine and Azithromycin    Review of Systems   Review of Systems  Genitourinary:  Positive for flank pain.  All other systems reviewed and are negative.   Physical Exam Updated Vital Signs BP (!) 171/97 (BP Location: Right Wrist)   Pulse 85   Temp 98.4 F (36.9 C) (Oral)   Resp 20   Ht 5\' 5"  (1.651 m)   Wt (!) 145.2 kg   LMP 01/29/2024 (Exact Date)  SpO2 100%   BMI 53.25 kg/m  Physical Exam Vitals and nursing note reviewed.   43 year old female, resting comfortably and in no acute distress. Vital signs are significant for elevated blood pressure. Oxygen saturation is 100%, which is normal. Head is normocephalic and atraumatic. PERRLA, EOMI.  Back is nontender in the midline.  There is mild right CVA tenderness. Lungs are clear without rales, wheezes, or rhonchi. Chest is nontender. Heart has regular rate and rhythm without murmur. Abdomen is soft, flat, nontender. Neurologic: Awake and alert, moves all extremities equally.  ED Results / Procedures / Treatments   Labs (all labs ordered are listed, but only abnormal results are displayed) Labs Reviewed  COMPREHENSIVE METABOLIC PANEL  WITH GFR  LIPASE, BLOOD  CBC WITH DIFFERENTIAL/PLATELET  URINALYSIS, ROUTINE W REFLEX MICROSCOPIC  PREGNANCY, URINE   Radiology No results found.  Procedures Procedures    Medications Ordered in ED Medications  ondansetron (ZOFRAN) injection 4 mg (has no administration in time range)  HYDROmorphone (DILAUDID) injection 0.5 mg (has no administration in time range)  sodium chloride 0.9 % bolus 1,000 mL (has no administration in time range)    ED Course/ Medical Decision Making/ A&P                                 Medical Decision Making Amount and/or Complexity of Data Reviewed Labs: ordered. Radiology: ordered.  Risk Prescription drug management.   Right flank pain.  Differential diagnosis includes, but is not limited to, urolithiasis, pyelonephritis, pancreatitis, cholangitis, diverticulitis, musculoskeletal pain.  I have ordered laboratory workup and renal stone protocol CT scan.  Case is signed out to Dr. Tamela Fake, oncoming physician.  Final Clinical Impression(s) / ED Diagnoses Final diagnoses:  Right flank pain    Rx / DC Orders ED Discharge Orders     None         Alissa April, MD 02/07/24 [phone]

## 2024-02-07 NOTE — Anesthesia Preprocedure Evaluation (Addendum)
 Anesthesia Evaluation  Patient identified by MRN, date of birth, ID band Patient awake    Reviewed: Allergy & Precautions, NPO status , Patient's Chart, lab work & pertinent test results  Airway Mallampati: II  TM Distance: >3 FB Neck ROM: Full   Comment: Previous grade II view with MAC 3 Dental no notable dental hx.    Pulmonary sleep apnea    Pulmonary exam normal breath sounds clear to auscultation       Cardiovascular hypertension (HCTZ), Pt. 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)    GI/Hepatic ,GERD  ,,S/p sleeve gastroplasty   Endo/Other  Hypothyroidism  Class 4 obesity  Renal/GU Renal disease (stone)     Musculoskeletal   Abdominal  (+) + obese  Peds  Hematology Lab Results      Component                Value               Date                      WBC                      9.9                 02/07/2024                HGB                      15.5 (H)            02/07/2024                HCT                      45.0                02/07/2024                MCV                      90.5                02/07/2024                PLT                      362                 02/07/2024              Anesthesia Other Findings   Reproductive/Obstetrics                             Anesthesia Physical Anesthesia Plan  ASA: 3 and emergent  Anesthesia Plan: General   Post-op Pain Management:    Induction: Intravenous  PONV Risk Score and Plan: 3 and Ondansetron , Dexamethasone , Treatment may vary due to age or medical condition and Midazolam   Airway Management Planned: LMA  Additional Equipment:   Intra-op Plan:   Post-operative Plan: Extubation in OR  Informed Consent:      Dental advisory given  Plan Discussed with: Anesthesiologist and CRNA  Anesthesia Plan  Comments: (Risks of general anesthesia discussed including,  but not limited to, sore throat, hoarse voice, chipped/damaged teeth, injury to vocal cords, nausea and vomiting, allergic reactions, lung infection, heart attack, stroke, and death. All questions answered. )        Anesthesia Quick Evaluation

## 2024-02-08 ENCOUNTER — Encounter (HOSPITAL_COMMUNITY): Payer: Self-pay | Admitting: Urology

## 2024-02-08 LAB — URINE CULTURE: Culture: NO GROWTH

## 2024-02-14 ENCOUNTER — Other Ambulatory Visit: Payer: Self-pay | Admitting: Urology

## 2024-02-19 NOTE — Patient Instructions (Signed)
 SURGICAL WAITING ROOM VISITATION  Patients having surgery or a procedure may have no more than 2 support people in the waiting area - these visitors may rotate.    Children under the age of 27 must have an adult with them who is not the patient.  Visitors with respiratory illnesses are discouraged from visiting and should remain at home.  If the patient needs to stay at the hospital during part of their recovery, the visitor guidelines for inpatient rooms apply. Pre-op nurse will coordinate an appropriate time for 1 support person to accompany patient in pre-op.  This support person may not rotate.    Please refer to the [facility] website for the visitor guidelines for Inpatients (after your surgery is over and you are in a regular room).       Your procedure is scheduled on:  03/07/2024    Report to [facility] Main Entrance    Report to admitting at   1130AM   Call this number if you have problems the morning of surgery [phone]   Do not eat food or drink liquids  :After Midnight.                If you have questions, please contact your surgeon's offic     Oral Hygiene is also important to reduce your risk of infection.                                    Remember - BRUSH YOUR TEETH THE MORNING OF SURGERY WITH YOUR REGULAR TOOTHPASTE  DENTURES WILL BE REMOVED PRIOR TO SURGERY PLEASE DO NOT APPLY "Poly grip" OR ADHESIVES!!!   Do NOT smoke after Midnight   Stop all vitamins and herbal supplements 7 days before surgery.   Take these medicines the morning of surgery with A SIP OF WATER :  synthrod   DO NOT TAKE ANY ORAL DIABETIC MEDICATIONS DAY OF YOUR SURGERY  Bring CPAP mask and tubing day of surgery.                              You may not have any metal on your body including hair pins, jewelry, and body piercing             Do not wear make-up, lotions, powders, perfumes/cologne, or deodorant  Do not wear nail polish including gel and S&S,  artificial/acrylic nails, or any other type of covering on natural nails including finger and toenails. If you have artificial nails, gel coating, etc. that needs to be removed by a nail salon please have this removed prior to surgery or surgery may need to be canceled/ delayed if the surgeon/ anesthesia feels like they are unable to be safely monitored.   Do not shave  48 hours prior to surgery.               Men may shave face and neck.   Do not bring valuables to the hospital. [facility] IS NOT             RESPONSIBLE   FOR VALUABLES.   Contacts, glasses, dentures or bridgework may not be worn into surgery.   Bring small overnight bag day of surgery.   DO NOT BRING YOUR HOME MEDICATIONS TO THE HOSPITAL. PHARMACY WILL DISPENSE MEDICATIONS LISTED ON YOUR MEDICATION LIST TO YOU DURING YOUR ADMISSION IN THE  HOSPITAL!    Patients discharged on the day of surgery will not be allowed to drive home.  Someone NEEDS to stay with you for the first 24 hours after anesthesia.   Special Instructions: Bring a copy of your healthcare power of attorney and living will documents the day of surgery if you haven't scanned them before.              Please read over the following fact sheets you were given: IF YOU HAVE QUESTIONS ABOUT YOUR PRE-OP INSTRUCTIONS PLEASE CALL [phone]   If you received a COVID test during your pre-op visit  it is requested that you wear a mask when out in public, stay away from anyone that may not be feeling well and notify your surgeon if you develop symptoms. If you test positive for Covid or have been in contact with anyone that has tested positive in the last 10 days please notify you surgeon.    [facility] - Preparing for Surgery Before surgery, you can play an important role.  Because skin is not sterile, your skin needs to be as free of germs as possible.  You can reduce the number of germs on your skin by washing with CHG (chlorahexidine gluconate) soap before  surgery.  CHG is an antiseptic cleaner which kills germs and bonds with the skin to continue killing germs even after washing. Please DO NOT use if you have an allergy to CHG or antibacterial soaps.  If your skin becomes reddened/irritated stop using the CHG and inform your nurse when you arrive at Short Stay. Do not shave (including legs and underarms) for at least 48 hours prior to the first CHG shower.  You may shave your face/neck. Please follow these instructions carefully:  1.  Shower with CHG Soap the night before surgery and the  morning of Surgery.  2.  If you choose to wash your hair, wash your hair first as usual with your  normal  shampoo.  3.  After you shampoo, rinse your hair and body thoroughly to remove the  shampoo.                           4.  Use CHG as you would any other liquid soap.  You can apply chg directly  to the skin and wash                       Gently with a scrungie or clean washcloth.  5.  Apply the CHG Soap to your body ONLY FROM THE NECK DOWN.   Do not use on face/ open                           Wound or open sores. Avoid contact with eyes, ears mouth and genitals (private parts).                       Wash face,  Genitals (private parts) with your normal soap.             6.  Wash thoroughly, paying special attention to the area where your surgery  will be performed.  7.  Thoroughly rinse your body with warm water  from the neck down.  8.  DO NOT shower/wash with your normal soap after using and rinsing off  the CHG Soap.  9.  Pat yourself dry with a clean towel.            10.  Wear clean pajamas.            11.  Place clean sheets on your bed the night of your first shower and do not  sleep with pets. Day of Surgery : Do not apply any lotions/deodorants the morning of surgery.  Please wear clean clothes to the hospital/surgery center.  FAILURE TO FOLLOW THESE INSTRUCTIONS MAY RESULT IN THE CANCELLATION OF YOUR SURGERY PATIENT  SIGNATURE_________________________________  NURSE SIGNATURE__________________________________  ________________________________________________________________________

## 2024-02-19 NOTE — Progress Notes (Signed)
 Anesthesia Review:  PCP: Cardiologist :  PPM/ ICD: Device Orders: Rep Notified:  Chest x-ray : EKG : Echo : Stress test: Cardiac Cath :   Activity level:  Sleep Study/ CPAP : Fasting Blood Sugar :      / Checks Blood Sugar -- times a day:    Blood Thinner/ Instructions /Last Dose: ASA / Instructions/ Last Dose :    02/07/24- Cbc/diff and cmp done  02/07/24- In ED  02/07/24- cysto

## 2024-02-22 ENCOUNTER — Encounter (HOSPITAL_COMMUNITY): Payer: Self-pay

## 2024-02-22 ENCOUNTER — Other Ambulatory Visit: Payer: Self-pay

## 2024-02-22 ENCOUNTER — Encounter (HOSPITAL_COMMUNITY)
Admission: RE | Admit: 2024-02-22 | Discharge: 2024-02-22 | Disposition: A | Discharge status: Home / Self Care | Source: Ambulatory Visit | Attending: Urology | Admitting: Urology

## 2024-02-22 VITALS — BP 131/101 | HR 93 | Temp 99.0°F | Resp 16 | Ht 65.0 in | Wt 323.0 lb

## 2024-02-22 DIAGNOSIS — Z01818 Encounter for other preprocedural examination: Secondary | ICD-10-CM | POA: Insufficient documentation

## 2024-02-22 HISTORY — DX: Personal history of urinary calculi: Z87.442

## 2024-02-22 HISTORY — DX: Anxiety disorder, unspecified: F41.9

## 2024-02-22 HISTORY — DX: Depression, unspecified: F32.A

## 2024-02-22 LAB — CBC
HCT: 47.2 % — ABNORMAL HIGH (ref 36.0–46.0)
Hemoglobin: 15.7 g/dL — ABNORMAL HIGH (ref 12.0–15.0)
MCH: 31 pg (ref 26.0–34.0)
MCHC: 33.3 g/dL (ref 30.0–36.0)
MCV: 93.3 fL (ref 80.0–100.0)
Platelets: 376 10*3/uL (ref 150–400)
RBC: 5.06 MIL/uL (ref 3.87–5.11)
RDW: 12.2 % (ref 11.5–15.5)
WBC: 11.1 10*3/uL — ABNORMAL HIGH (ref 4.0–10.5)
nRBC: 0 % (ref 0.0–0.2)

## 2024-02-22 LAB — BASIC METABOLIC PANEL WITH GFR
Anion gap: 10 (ref 5–15)
BUN: 18 mg/dL (ref 6–20)
CO2: 21 mmol/L — ABNORMAL LOW (ref 22–32)
Calcium: 8.8 mg/dL — ABNORMAL LOW (ref 8.9–10.3)
Chloride: 111 mmol/L (ref 98–111)
Creatinine, Ser: 0.92 mg/dL (ref 0.44–1.00)
GFR, Estimated: >60 mL/min (ref >60)
Glucose, Bld: 103 mg/dL — ABNORMAL HIGH (ref 70–99)
Potassium: 4 mmol/L (ref 3.5–5.1)
Sodium: 142 mmol/L (ref 135–145)

## 2024-03-07 ENCOUNTER — Ambulatory Visit (HOSPITAL_COMMUNITY): Payer: Self-pay | Admitting: Physician Assistant

## 2024-03-07 ENCOUNTER — Ambulatory Visit (HOSPITAL_COMMUNITY)

## 2024-03-07 ENCOUNTER — Telehealth: Payer: Self-pay | Admitting: Urology

## 2024-03-07 ENCOUNTER — Ambulatory Visit (HOSPITAL_COMMUNITY)
Admission: RE | Admit: 2024-03-07 | Discharge: 2024-03-07 | Disposition: A | Discharge status: Home / Self Care | Source: Ambulatory Visit | Attending: Urology | Admitting: Urology

## 2024-03-07 ENCOUNTER — Encounter (HOSPITAL_COMMUNITY)
Admission: RE | Disposition: A | Discharge status: Home / Self Care | Payer: Self-pay | Source: Ambulatory Visit | Attending: Urology

## 2024-03-07 DIAGNOSIS — F32A Depression, unspecified: Secondary | ICD-10-CM | POA: Diagnosis not present

## 2024-03-07 DIAGNOSIS — G43909 Migraine, unspecified, not intractable, without status migrainosus: Secondary | ICD-10-CM | POA: Insufficient documentation

## 2024-03-07 DIAGNOSIS — Z6841 Body Mass Index (BMI) 40.0 and over, adult: Secondary | ICD-10-CM | POA: Diagnosis not present

## 2024-03-07 DIAGNOSIS — Z01818 Encounter for other preprocedural examination: Secondary | ICD-10-CM

## 2024-03-07 DIAGNOSIS — E039 Hypothyroidism, unspecified: Secondary | ICD-10-CM | POA: Diagnosis not present

## 2024-03-07 DIAGNOSIS — F419 Anxiety disorder, unspecified: Secondary | ICD-10-CM | POA: Diagnosis not present

## 2024-03-07 DIAGNOSIS — G4730 Sleep apnea, unspecified: Secondary | ICD-10-CM | POA: Insufficient documentation

## 2024-03-07 DIAGNOSIS — N202 Calculus of kidney with calculus of ureter: Secondary | ICD-10-CM | POA: Diagnosis present

## 2024-03-07 DIAGNOSIS — F909 Attention-deficit hyperactivity disorder, unspecified type: Secondary | ICD-10-CM | POA: Diagnosis not present

## 2024-03-07 DIAGNOSIS — E66813 Obesity, class 3: Secondary | ICD-10-CM | POA: Insufficient documentation

## 2024-03-07 SURGERY — CYSTOSCOPY/URETEROSCOPY/HOLMIUM LASER/STENT PLACEMENT
Anesthesia: General | Laterality: Right

## 2024-03-07 MED ORDER — DEXMEDETOMIDINE HCL IN NACL 80 MCG/20ML IV SOLN
INTRAVENOUS | Status: DC | PRN
Start: 2024-03-07 — End: 2024-03-07
  Administered 2024-03-07: 8 ug via INTRAVENOUS

## 2024-03-07 MED ORDER — FENTANYL CITRATE PF 50 MCG/ML IJ SOSY
PREFILLED_SYRINGE | INTRAMUSCULAR | Status: AC
Start: 2024-03-07 — End: 2024-03-07
  Filled 2024-03-07: qty 1

## 2024-03-07 MED ORDER — ONDANSETRON HCL 4 MG/2ML IJ SOLN
INTRAMUSCULAR | Status: AC
  Filled 2024-03-07: qty 2

## 2024-03-07 MED ORDER — PROPOFOL 1000 MG/100ML IV EMUL
INTRAVENOUS | Status: AC
  Filled 2024-03-07: qty 100

## 2024-03-07 MED ORDER — CELECOXIB 200 MG PO CAPS: 200.0000 mg | ORAL_CAPSULE | Freq: Once | ORAL | Status: DC

## 2024-03-07 MED ORDER — ORAL CARE MOUTH RINSE: 15.0000 mL | Freq: Once | OROMUCOSAL | Status: AC

## 2024-03-07 MED ORDER — DEXAMETHASONE SODIUM PHOSPHATE 10 MG/ML IJ SOLN
INTRAMUSCULAR | Status: DC | PRN
  Administered 2024-03-07: 10 mg via INTRAVENOUS

## 2024-03-07 MED ORDER — ONDANSETRON HCL 4 MG/2ML IJ SOLN
INTRAMUSCULAR | Status: DC | PRN
  Administered 2024-03-07: 4 mg via INTRAVENOUS

## 2024-03-07 MED ORDER — PROPOFOL 10 MG/ML IV BOLUS
INTRAVENOUS | Status: AC
  Filled 2024-03-07: qty 20

## 2024-03-07 MED ORDER — ACETAMINOPHEN 500 MG PO TABS
ORAL_TABLET | ORAL | Status: AC
Start: 2024-03-07 — End: 2024-03-07
  Filled 2024-03-07: qty 2

## 2024-03-07 MED ORDER — PROPOFOL 1000 MG/100ML IV EMUL
INTRAVENOUS | Status: AC
Start: 2024-03-07 — End: 2024-03-07
  Filled 2024-03-07: qty 100

## 2024-03-07 MED ORDER — FENTANYL CITRATE (PF) 100 MCG/2ML IJ SOLN
INTRAMUSCULAR | Status: DC | PRN
  Administered 2024-03-07: 50 ug via INTRAVENOUS
  Administered 2024-03-07: 100 ug via INTRAVENOUS
  Administered 2024-03-07: 50 ug via INTRAVENOUS
  Administered 2024-03-07: 100 ug via INTRAVENOUS

## 2024-03-07 MED ORDER — PROPOFOL 500 MG/50ML IV EMUL
INTRAVENOUS | Status: DC | PRN
  Administered 2024-03-07: 175 ug/kg/min via INTRAVENOUS

## 2024-03-07 MED ORDER — MIDAZOLAM HCL 5 MG/5ML IJ SOLN
INTRAMUSCULAR | Status: DC | PRN
  Administered 2024-03-07: 2 mg via INTRAVENOUS

## 2024-03-07 MED ORDER — FENTANYL CITRATE (PF) 100 MCG/2ML IJ SOLN
INTRAMUSCULAR | Status: AC
  Filled 2024-03-07: qty 2

## 2024-03-07 MED ORDER — ACETAMINOPHEN 500 MG PO TABS
1000.0000 mg | ORAL_TABLET | Freq: Once | ORAL | Status: AC
  Administered 2024-03-07: 1000 mg via ORAL

## 2024-03-07 MED ORDER — LIDOCAINE HCL (PF) 2 % IJ SOLN
INTRAMUSCULAR | Status: AC
  Filled 2024-03-07: qty 5

## 2024-03-07 MED ORDER — PROPOFOL 10 MG/ML IV BOLUS
INTRAVENOUS | Status: DC | PRN
  Administered 2024-03-07: 200 mg via INTRAVENOUS

## 2024-03-07 MED ORDER — CEFAZOLIN SODIUM-DEXTROSE 3-4 GM/150ML-% IV SOLN
3.0000 g | INTRAVENOUS | Status: AC
  Administered 2024-03-07: 3 g via INTRAVENOUS
  Filled 2024-03-07: qty 150

## 2024-03-07 MED ORDER — FENTANYL CITRATE PF 50 MCG/ML IJ SOSY
25.0000 ug | PREFILLED_SYRINGE | INTRAMUSCULAR | Status: DC | PRN
  Administered 2024-03-07: 50 ug via INTRAVENOUS

## 2024-03-07 MED ORDER — CHLORHEXIDINE GLUCONATE 0.12 % MT SOLN
15.0000 mL | Freq: Once | OROMUCOSAL | Status: AC
  Administered 2024-03-07: 15 mL via OROMUCOSAL

## 2024-03-07 MED ORDER — DEXAMETHASONE SODIUM PHOSPHATE 10 MG/ML IJ SOLN
INTRAMUSCULAR | Status: AC
  Filled 2024-03-07: qty 1

## 2024-03-07 MED ORDER — DROPERIDOL 2.5 MG/ML IJ SOLN
0.6250 mg | Freq: Once | INTRAMUSCULAR | Status: AC | PRN
  Administered 2024-03-07: 0.625 mg via INTRAVENOUS

## 2024-03-07 MED ORDER — MIDAZOLAM HCL 2 MG/2ML IJ SOLN
INTRAMUSCULAR | Status: AC
  Filled 2024-03-07: qty 2

## 2024-03-07 MED ORDER — DROPERIDOL 2.5 MG/ML IJ SOLN
INTRAMUSCULAR | Status: AC
  Filled 2024-03-07: qty 2

## 2024-03-07 MED ORDER — FENTANYL CITRATE (PF) 100 MCG/2ML IJ SOLN
INTRAMUSCULAR | Status: AC
Start: 2024-03-07 — End: 2024-03-07
  Filled 2024-03-07: qty 2

## 2024-03-07 MED ORDER — OXYCODONE HCL 5 MG PO TABS: 5.0000 mg | ORAL_TABLET | Freq: Four times a day (QID) | ORAL | 0 refills | Status: AC | PRN

## 2024-03-07 MED ORDER — SODIUM CHLORIDE 0.9 % IR SOLN
Status: DC | PRN
  Administered 2024-03-07: 3000 mL

## 2024-03-07 MED ORDER — PROPOFOL 500 MG/50ML IV EMUL
INTRAVENOUS | Status: AC
  Filled 2024-03-07: qty 50

## 2024-03-07 MED ORDER — LACTATED RINGERS IV SOLN
INTRAVENOUS | Status: DC
Start: 2024-03-07 — End: 2024-03-07

## 2024-03-07 MED ORDER — LIDOCAINE HCL (CARDIAC) PF 100 MG/5ML IV SOSY
PREFILLED_SYRINGE | INTRAVENOUS | Status: DC | PRN
  Administered 2024-03-07: 100 mg via INTRAVENOUS

## 2024-03-07 SURGICAL SUPPLY — 20 items
BAG URO CATCHER STRL LF (MISCELLANEOUS) ×1 IMPLANT
BASKET ZERO TIP NITINOL 2.4FR (BASKET) IMPLANT
CATH URETL OPEN END 6FR 70 (CATHETERS) IMPLANT
CLOTH BEACON ORANGE TIMEOUT ST (SAFETY) ×1 IMPLANT
DRSG TEGADERM 2-3/8X2-3/4 SM (GAUZE/BANDAGES/DRESSINGS) IMPLANT
DRSG TEGADERM 4X4.75 (GAUZE/BANDAGES/DRESSINGS) IMPLANT
GLOVE SURG LX STRL 7.5 STRW (GLOVE) ×1 IMPLANT
GOWN STRL REUS W/ TWL XL LVL3 (GOWN DISPOSABLE) ×1 IMPLANT
GUIDEWIRE STR DUAL SENSOR (WIRE) ×1 IMPLANT
GUIDEWIRE ZIPWRE .038 STRAIGHT (WIRE) IMPLANT
IV NS 1000ML BAXH (IV SOLUTION) ×1 IMPLANT
KIT TURNOVER KIT A (KITS) ×1 IMPLANT
MANIFOLD NEPTUNE II (INSTRUMENTS) ×1 IMPLANT
MAT HALF PREVALON HALF STRYKER (MISCELLANEOUS) IMPLANT
PACK CYSTO (CUSTOM PROCEDURE TRAY) ×1 IMPLANT
SHEATH NAVIGATOR HD 12/14X36 (SHEATH) IMPLANT
STENT CONTOUR 6FRX26X.038 (STENTS) IMPLANT
TRACTIP FLEXIVA PULS ID 200XHI (Laser) IMPLANT
TUBING CONNECTING 10 (TUBING) ×1 IMPLANT
TUBING UROLOGY SET (TUBING) ×1 IMPLANT

## 2024-03-07 NOTE — Interval H&P Note (Signed)
 History and Physical Interval Note:  03/07/2024 12:56 PM  Cynthia Green  has presented today for surgery, with the diagnosis of RIGHT URETERAL AND RENAL CALCULI.  The various methods of treatment have been discussed with the patient and family. After consideration of risks, benefits and other options for treatment, the patient has consented to  Procedure(s): CYSTOSCOPY/URETEROSCOPY/HOLMIUM LASER/STENT PLACEMENT (Right) as a surgical intervention.  The patient's history has been reviewed, patient examined, no change in status, stable for surgery.  I have reviewed the patient's chart and labs.  Questions were answered to the patient's satisfaction.     [employer]

## 2024-03-07 NOTE — Anesthesia Preprocedure Evaluation (Signed)
 Anesthesia Evaluation  Patient identified by MRN, date of birth, ID band Patient awake    Reviewed: Allergy & Precautions, NPO status , Patient's Chart, lab work & pertinent test results  Airway Mallampati: II  TM Distance: >3 FB Neck ROM: Full    Dental no notable dental hx.    Pulmonary sleep apnea and Continuous Positive Airway Pressure Ventilation    Pulmonary exam normal        Cardiovascular negative cardio ROS  Rhythm:Regular Rate:Normal     Neuro/Psych  Headaches  Anxiety Depression       GI/Hepatic negative GI ROS, Neg liver ROS,,,  Endo/Other  Hypothyroidism  Class 3 obesity  Renal/GU negative Renal ROS  negative genitourinary   Musculoskeletal negative musculoskeletal ROS (+)    Abdominal Normal abdominal exam  (+)   Peds  (+) ADHD Hematology Lab Results      Component                Value               Date                      WBC                      11.1 (H)            02/22/2024                HGB                      15.7 (H)            02/22/2024                HCT                      47.2 (H)            02/22/2024                MCV                      93.3                02/22/2024                PLT                      376                 02/22/2024              Anesthesia Other Findings   Reproductive/Obstetrics                             Anesthesia Physical Anesthesia Plan  ASA: 3  Anesthesia Plan: General   Post-op Pain Management: Tylenol  PO (pre-op)* and Celebrex PO (pre-op)*   Induction: Intravenous  PONV Risk Score and Plan: 3 and Ondansetron , Dexamethasone , Midazolam  and Treatment may vary due to age or medical condition  Airway Management Planned: Mask and LMA  Additional Equipment: None  Intra-op Plan:   Post-operative Plan: Extubation in OR  Informed Consent: I have reviewed the patients History and Physical, chart, labs and discussed the  procedure including the risks, benefits and alternatives for the proposed anesthesia with the patient  or authorized representative who has indicated his/her understanding and acceptance.     Dental advisory given  Plan Discussed with: CRNA  Anesthesia Plan Comments:        Anesthesia Quick Evaluation

## 2024-03-07 NOTE — Anesthesia Procedure Notes (Signed)
 Procedure Name: LMA Insertion Date/Time: 03/07/2024 1:36 PM  Performed by: Carleton Garnette SAUNDERS, CRNAPre-anesthesia Checklist: Patient identified, Emergency Drugs available, Suction available, Patient being monitored and Timeout performed Patient Re-evaluated:Patient Re-evaluated prior to induction Oxygen Delivery Method: Circle system utilized Preoxygenation: Pre-oxygenation with 100% oxygen Induction Type: IV induction LMA: LMA with gastric port inserted LMA Size: 4.0 Tube type: Oral Number of attempts: 1 Placement Confirmation: positive ETCO2 and breath sounds checked- equal and bilateral Tube secured with: Tape Dental Injury: Teeth and Oropharynx as per pre-operative assessment

## 2024-03-07 NOTE — Op Note (Signed)
 Preoperative diagnosis: Right renal calculi  Postoperative diagnosis: Right renal calculi  Procedure:  Cystoscopy Right ureteroscopy and stone removal Ureteroscopic laser lithotripsy Right ureteral stent placement (6 x 26 with string)  Surgeon: Gretel CANDIE Ferrara, Mickey. M.D.  Anesthesia: General  Complications: None  EBL: Minimal  Specimens: Right renal calculi  Disposition of specimens: [facility] for stone analysis  Indication: Cynthia Green  is a 43 y.o. patient with urolithiasis who presented with a large 1.5 cm obstructing UPJ right sided calculus and underwent initial stent placement for pain control. After reviewing the management options for treatment, they elected to proceed with the above surgical procedure(s). We have discussed the potential benefits and risks of the procedure, side effects of the proposed treatment, the likelihood of the patient achieving the goals of the procedure, and any potential problems that might occur during the procedure or recuperation. Informed consent has been obtained.  Description of procedure:  The patient was taken to the operating room and general anesthesia was induced.  The patient was placed in the dorsal lithotomy position, prepped and draped in the usual sterile fashion, and preoperative antibiotics were administered. A preoperative time-out was performed.   Cystourethroscopy was performed.  The patient's urethra was examined and was normal. The bladder was then systematically examined in its entirety. There was no evidence for any bladder tumors, stones, or other mucosal pathology.    Attention then turned to the right ureteral orifice and the indwelling stent was removed.  A 0.38 sensor guidewire was then advanced up the right ureter into the renal pelvis under fluoroscopic guidance.  A 12/14 Fr ureteral access sheath was then advanced over the guide wire. The digital flexible ureteroscope was then advanced  through the access sheath into the ureter next to the guidewire and the calculus was identified and was located at the UPJ with smaller stone in the lower pole calyx and one in the interpolar calyx.   The stone was then fragmented with the 200 micron holmium laser fiber on a setting of 0.3 J and frequency of 60 Hz to dust the stone into small fragments.   All sizable stones were then removed with a zero tip nitinol basket.  Reinspection of the ureter/renal pelvis revealed no remaining visible stones or fragments of significant size.   The safety wire was then replaced and the access sheath removed.  The guidewire was backloaded through the cystoscope and a ureteral stent was advance over the wire using Seldinger technique.  The stent was positioned appropriately under fluoroscopic and cystoscopic guidance.  The wire was then removed with an adequate stent curl noted in the renal pelvis as well as in the bladder.  The bladder was then emptied and the procedure ended.  The patient appeared to tolerate the procedure well and without complications.  The patient was able to be awakened and transferred to the recovery unit in satisfactory condition.   Gretel CANDIE Ferrara Teddie MD

## 2024-03-07 NOTE — Telephone Encounter (Signed)
 Spoke with patient,   S/p R USE today, noted stent had become dislodged. No fevers or chills, no nausea/vomiting. Plan was to pull stent on Monday, however distal curl entirely out of body, recommended patient remove her stent now. Watch for fevers, chills, increased or worsening pain, all reasons to present to the ED.   Appreciative of call

## 2024-03-07 NOTE — OR Nursing (Signed)
Stone taken by Dr. Borden 

## 2024-03-07 NOTE — Discharge Instructions (Addendum)
You may see some blood in the urine and may have some burning with urination for 48-72 hours. You also may notice that you have to urinate more frequently or urgently after your procedure which is normal.  You should call should you develop an inability urinate, fever > 101, persistent nausea and vomiting that prevents you from eating or drinking to stay hydrated.  If you have a stent, you will likely urinate more frequently and urgently until the stent is removed and you may experience some discomfort/pain in the lower abdomen and flank especially when urinating. You may take pain medication prescribed to you if needed for pain. You may also intermittently have blood in the urine until the stent is removed. You may remove your stent on Monday morning.  Simply pull the string that is taped to your body and the stent will easily come out.  This may be best done in the shower as some urine may come out with the stent.  Usually you will feel relief once the stent is removed, but occasionally patients can develop pain due to residual swelling of the ureter that may temporarily obstruct the kidney.  This can be managed by taking pain medication and it will typically resolve with time.  Please do not hesitate to call if you have pain that is not controlled with your pain medication or does not improved within 24-48 hours.  

## 2024-03-07 NOTE — Transfer of Care (Signed)
 Immediate Anesthesia Transfer of Care Note  Patient: Cynthia Green Line  Procedure(s) Performed: CYSTOSCOPY/URETEROSCOPY/HOLMIUM LASER/STENT PLACEMENT (Right)  Patient Location: PACU  Anesthesia Type:General  Level of Consciousness: awake  Airway & Oxygen Therapy: Patient Spontanous Breathing and Patient connected to face mask oxygen  Post-op Assessment: Report given to RN and Post -op Vital signs reviewed and stable  Post vital signs: Reviewed and stable  Last Vitals:  Vitals Value Taken Time  BP 143/89 03/07/24 14:36  Temp    Pulse 86 03/07/24 14:38  Resp 14 03/07/24 14:38  SpO2 100 % 03/07/24 14:38  Vitals shown include unfiled device data.  Last Pain:  Vitals:   03/07/24 1222  TempSrc:   PainSc: 0-No pain         Complications: No notable events documented.

## 2024-03-08 ENCOUNTER — Encounter (HOSPITAL_COMMUNITY): Payer: Self-pay | Admitting: Urology

## 2024-03-08 NOTE — Anesthesia Postprocedure Evaluation (Signed)
 Anesthesia Post Note  Patient: Cynthia Green [place]  Procedure(s) Performed: CYSTOSCOPY/URETEROSCOPY/HOLMIUM LASER/STENT PLACEMENT (Right)     Patient location during evaluation: PACU Anesthesia Type: General Level of consciousness: awake and alert Pain management: pain level controlled Vital Signs Assessment: post-procedure vital signs reviewed and stable Respiratory status: spontaneous breathing, nonlabored ventilation, respiratory function stable and patient connected to nasal cannula oxygen Cardiovascular status: blood pressure returned to baseline and stable Postop Assessment: no apparent nausea or vomiting Anesthetic complications: no   No notable events documented.  Last Vitals:  Vitals:   03/07/24 1515 03/07/24 1530  BP: 135/86 (!) 152/82  Pulse: 63 62  Resp: 13   Temp: (!) 36.4 C   SpO2: 100% 97%    Last Pain:  Vitals:   03/07/24 1549  TempSrc:   PainSc: 6                  Gabreille Dardis P Alaiza Yau
